# Patient Record
Sex: Male | Born: 1935 | Race: White | Hispanic: No | Marital: Single | Smoking: Never smoker
Health system: Southern US, Community
[De-identification: ages and names within clinical notes are randomized; demographics above are authoritative.]

## PROBLEM LIST (undated history)

## (undated) DIAGNOSIS — M1990 Unspecified osteoarthritis, unspecified site: Secondary | ICD-10-CM

## (undated) HISTORY — PX: APPENDECTOMY: SHX54

---

## 2001-07-31 ENCOUNTER — Encounter: Payer: Self-pay | Admitting: *Deleted

## 2001-07-31 ENCOUNTER — Emergency Department (HOSPITAL_COMMUNITY): Admission: EM | Admit: 2001-07-31 | Discharge: 2001-07-31 | Payer: Self-pay | Admitting: Emergency Medicine

## 2004-09-22 ENCOUNTER — Ambulatory Visit: Payer: Self-pay | Admitting: Family Medicine

## 2004-09-30 ENCOUNTER — Ambulatory Visit: Payer: Self-pay | Admitting: Gastroenterology

## 2004-10-12 ENCOUNTER — Ambulatory Visit: Payer: Self-pay | Admitting: Gastroenterology

## 2006-05-15 ENCOUNTER — Ambulatory Visit: Payer: Self-pay | Admitting: Family Medicine

## 2007-01-15 ENCOUNTER — Ambulatory Visit: Payer: Self-pay | Admitting: Family Medicine

## 2010-12-30 NOTE — Assessment & Plan Note (Signed)
Antonio [name]                              [facility] OFFICE NOTE   Antonio [name]                    MRN:          [id number]  DATE:05/15/2006                            DOB:          [birth year]    This is a 75 year old gentleman here for a complete physical examination.  He does have a couple of problems to discuss.  First off for six months, he  has had intermittent sharp pains in the lateral right hip.  There has been  no history of trauma.  He has taken no medications for it.  It primarily  bothers him when he does a lot of a walking or when he goes up and down  steps.  There is no back pain, no radiation of pain into the leg, no  neurologic deficits.  He also has had some mild problems with maintaining  and achieving erections over the past year.  He would like to try a  medication for that.   IMMUNIZATION HISTORY:  He got his yearly flu shot last week.   For other details of his past medical history, family history, social  history, etc., refer to last dictated clinic note when I met him on September 22, 2004.   ALLERGIES:  None.   CURRENT MEDICATIONS:  None.   OBJECTIVE:  Height 5 foot 11 inches.  Weight 190.  BP 128/70, pulse 72 and  regular.  GENERAL:  He appears to be quite healthy.  He walks and gets up and down  from the examination table easily.  SKIN:  Clear.  EYES:  Clear.  EARS:  Clear.  PHARYNX:  Clear.  NECK:  Supple without lymphadenopathy or masses.  LUNGS:  Clear.  CARDIAC:  Rate and rhythm regular without gallops, murmurs or rubs.  Distal  pulses are full.  EKG is within normal limits.  ABDOMEN:  Soft, bowel sounds, nontender, no masses.  GENITALIA:  Normal male.  RECTAL EXAM:  No masses or tenderness.  Prostate is within normal limits.  Stool hemoccult negative.  EXTREMITIES:  No clubbing, cyanosis, or edema.  Examination of the right hip  does show some mild pain on extremes of internal and external  rotation.  However, his range of motion is full and he has no crepitus.  NEUROLOGICAL:  Grossly intact.   ASSESSMENT/PLAN:  1. Complete physical:  She will get the usual laboratories.  2. Erectile dysfunction:  I gave him samples to try Cialis 20 mg as      needed.  3. 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            ______________________________  Tera Mater. Clent Ridges, MD     SAF/MedQ  DD:  05/15/2006  DT:  05/17/2006  Job #:  [id number]

## 2011-09-16 ENCOUNTER — Inpatient Hospital Stay: Payer: Self-pay | Admitting: Surgery

## 2011-09-16 LAB — CBC WITH DIFFERENTIAL/PLATELET
Basophil #: 0 10*3/uL (ref 0.0–0.1)
Basophil %: 0.2 %
Eosinophil #: 0.1 10*3/uL (ref 0.0–0.7)
HCT: 43.2 % (ref 40.0–52.0)
HGB: 14.7 g/dL (ref 13.0–18.0)
Lymphocyte %: 12 %
MCHC: 34.1 g/dL (ref 32.0–36.0)
MCV: 92 fL (ref 80–100)
Monocyte #: 1.2 10*3/uL — ABNORMAL HIGH (ref 0.0–0.7)
Monocyte %: 9.5 %
RDW: 12.4 % (ref 11.5–14.5)
WBC: 12.3 10*3/uL — ABNORMAL HIGH (ref 3.8–10.6)

## 2011-09-16 LAB — COMPREHENSIVE METABOLIC PANEL
Albumin: 3.5 g/dL (ref 3.4–5.0)
Alkaline Phosphatase: 106 U/L (ref 50–136)
Anion Gap: 10 (ref 7–16)
BUN: 20 mg/dL — ABNORMAL HIGH (ref 7–18)
Bilirubin,Total: 0.9 mg/dL (ref 0.2–1.0)
Chloride: 98 mmol/L (ref 98–107)
Co2: 27 mmol/L (ref 21–32)
Creatinine: 1.17 mg/dL (ref 0.60–1.30)
EGFR (African American): >60
EGFR (Non-African Amer.): >60
Glucose: 107 mg/dL — ABNORMAL HIGH (ref 65–99)
Osmolality: 273 (ref 275–301)
Sodium: 135 mmol/L — ABNORMAL LOW (ref 136–145)
Total Protein: 8.2 g/dL (ref 6.4–8.2)

## 2011-09-16 LAB — URINALYSIS, COMPLETE
Bacteria: NONE SEEN
Glucose,UR: NEGATIVE mg/dL (ref 0–75)
Leukocyte Esterase: NEGATIVE
Nitrite: NEGATIVE
Protein: 30
WBC UR: 2 /HPF (ref 0–5)

## 2011-09-16 LAB — LIPASE, BLOOD: Lipase: 56 U/L — ABNORMAL LOW (ref 73–393)

## 2011-09-16 LAB — PROTIME-INR: INR: 1.1

## 2011-09-17 LAB — BASIC METABOLIC PANEL
BUN: 17 mg/dL (ref 7–18)
Calcium, Total: 8 mg/dL — ABNORMAL LOW (ref 8.5–10.1)
Co2: 26 mmol/L (ref 21–32)
EGFR (African American): >60
Glucose: 176 mg/dL — ABNORMAL HIGH (ref 65–99)
Osmolality: 276 (ref 275–301)
Potassium: 4.6 mmol/L (ref 3.5–5.1)
Sodium: 135 mmol/L — ABNORMAL LOW (ref 136–145)

## 2011-09-17 LAB — CBC WITH DIFFERENTIAL/PLATELET
Basophil %: 0.2 %
Eosinophil #: 0 10*3/uL (ref 0.0–0.7)
Eosinophil %: 0.1 %
HCT: 41.1 % (ref 40.0–52.0)
Lymphocyte %: 6.9 %
MCHC: 33.8 g/dL (ref 32.0–36.0)
MCV: 92 fL (ref 80–100)
Neutrophil %: 87.9 %
Platelet: 373 10*3/uL (ref 150–440)
RBC: 4.48 10*6/uL (ref 4.40–5.90)
RDW: 12.6 % (ref 11.5–14.5)

## 2011-09-18 LAB — CBC WITH DIFFERENTIAL/PLATELET
Basophil #: 0 10*3/uL (ref 0.0–0.1)
Basophil %: 0.1 %
Eosinophil #: 0.1 10*3/uL (ref 0.0–0.7)
HCT: 33.9 % — ABNORMAL LOW (ref 40.0–52.0)
Lymphocyte %: 9.8 %
MCH: 30.8 pg (ref 26.0–34.0)
MCHC: 33.7 g/dL (ref 32.0–36.0)
Monocyte #: 0.9 10*3/uL — ABNORMAL HIGH (ref 0.0–0.7)
Monocyte %: 8.2 %
Neutrophil #: 9.2 10*3/uL — ABNORMAL HIGH (ref 1.4–6.5)
RBC: 3.71 10*6/uL — ABNORMAL LOW (ref 4.40–5.90)
RDW: 13.1 % (ref 11.5–14.5)

## 2011-09-18 LAB — BASIC METABOLIC PANEL
Anion Gap: 10 (ref 7–16)
Calcium, Total: 8 mg/dL — ABNORMAL LOW (ref 8.5–10.1)
Chloride: 98 mmol/L (ref 98–107)
Co2: 27 mmol/L (ref 21–32)
Creatinine: 1.24 mg/dL (ref 0.60–1.30)
EGFR (African American): >60
Osmolality: 270 (ref 275–301)

## 2011-09-19 LAB — CBC WITH DIFFERENTIAL/PLATELET
Basophil %: 0.8 %
Eosinophil #: 0.3 10*3/uL (ref 0.0–0.7)
MCH: 30.5 pg (ref 26.0–34.0)
MCHC: 33.1 g/dL (ref 32.0–36.0)
MCV: 92 fL (ref 80–100)
Monocyte #: 0.9 10*3/uL — ABNORMAL HIGH (ref 0.0–0.7)
Neutrophil #: 6.5 10*3/uL (ref 1.4–6.5)
Neutrophil %: 72.6 %
Platelet: 404 10*3/uL (ref 150–440)
RBC: 3.68 10*6/uL — ABNORMAL LOW (ref 4.40–5.90)
WBC: 8.9 10*3/uL (ref 3.8–10.6)

## 2011-09-19 LAB — PATHOLOGY REPORT

## 2011-09-20 LAB — CBC WITH DIFFERENTIAL/PLATELET
Basophil #: 0 10*3/uL (ref 0.0–0.1)
Basophil %: 0.5 %
Lymphocyte %: 18.4 %
MCH: 30.6 pg (ref 26.0–34.0)
MCHC: 33.3 g/dL (ref 32.0–36.0)
MCV: 92 fL (ref 80–100)
Monocyte #: 0.9 10*3/uL — ABNORMAL HIGH (ref 0.0–0.7)
Monocyte %: 10.6 %
Platelet: 530 10*3/uL — ABNORMAL HIGH (ref 150–440)
RDW: 12.9 % (ref 11.5–14.5)
WBC: 8.6 10*3/uL (ref 3.8–10.6)

## 2011-09-20 LAB — BASIC METABOLIC PANEL
Anion Gap: 9 (ref 7–16)
BUN: 3 mg/dL — ABNORMAL LOW (ref 7–18)
Creatinine: 1.22 mg/dL (ref 0.60–1.30)
EGFR (African American): >60
EGFR (Non-African Amer.): >60
Glucose: 99 mg/dL (ref 65–99)

## 2011-09-21 LAB — CULTURE, BLOOD (SINGLE)

## 2011-09-22 LAB — CBC WITH DIFFERENTIAL/PLATELET
Basophil #: 0 10*3/uL (ref 0.0–0.1)
Eosinophil %: 2.8 %
HCT: 35.5 % — ABNORMAL LOW (ref 40.0–52.0)
Lymphocyte #: 1.1 10*3/uL (ref 1.0–3.6)
MCH: 30.4 pg (ref 26.0–34.0)
MCV: 90 fL (ref 80–100)
Monocyte #: 1 10*3/uL — ABNORMAL HIGH (ref 0.0–0.7)
Monocyte %: 13.2 %
Neutrophil #: 5.2 10*3/uL (ref 1.4–6.5)
Platelet: 636 10*3/uL — ABNORMAL HIGH (ref 150–440)
RBC: 3.93 10*6/uL — ABNORMAL LOW (ref 4.40–5.90)
RDW: 12.7 % (ref 11.5–14.5)

## 2011-09-22 LAB — BASIC METABOLIC PANEL
Creatinine: 1.26 mg/dL (ref 0.60–1.30)
EGFR (African American): >60
EGFR (Non-African Amer.): 60 — ABNORMAL LOW
Glucose: 106 mg/dL — ABNORMAL HIGH (ref 65–99)
Osmolality: 271 (ref 275–301)
Potassium: 4.4 mmol/L (ref 3.5–5.1)
Sodium: 137 mmol/L (ref 136–145)

## 2011-09-24 LAB — CBC WITH DIFFERENTIAL/PLATELET
Basophil #: 0 10*3/uL (ref 0.0–0.1)
Basophil %: 0.2 %
Eosinophil #: 0.2 10*3/uL (ref 0.0–0.7)
MCH: 30.5 pg (ref 26.0–34.0)
MCV: 92 fL (ref 80–100)
Monocyte #: 0.8 10*3/uL — ABNORMAL HIGH (ref 0.0–0.7)
Monocyte %: 10.9 %
Neutrophil #: 5.2 10*3/uL (ref 1.4–6.5)
Platelet: 730 10*3/uL — ABNORMAL HIGH (ref 150–440)
RDW: 13.3 % (ref 11.5–14.5)
WBC: 7.8 10*3/uL (ref 3.8–10.6)

## 2011-09-26 LAB — CREATININE, SERUM
Creatinine: 1.45 mg/dL — ABNORMAL HIGH (ref 0.60–1.30)
EGFR (Non-African Amer.): 51 — ABNORMAL LOW

## 2011-09-28 ENCOUNTER — Other Ambulatory Visit: Payer: Self-pay | Admitting: Surgery

## 2011-09-28 LAB — BASIC METABOLIC PANEL
Anion Gap: 12 (ref 7–16)
BUN: 14 mg/dL (ref 7–18)
Chloride: 102 mmol/L (ref 98–107)
Co2: 26 mmol/L (ref 21–32)
EGFR (Non-African Amer.): 53 — ABNORMAL LOW
Glucose: 101 mg/dL — ABNORMAL HIGH (ref 65–99)
Osmolality: 280 (ref 275–301)

## 2011-09-28 LAB — CBC WITH DIFFERENTIAL/PLATELET
Basophil %: 0.8 %
Eosinophil #: 0.1 10*3/uL (ref 0.0–0.7)
Eosinophil %: 2.1 %
HGB: 12.5 g/dL — ABNORMAL LOW (ref 13.0–18.0)
Lymphocyte %: 25.4 %
MCH: 29.4 pg (ref 26.0–34.0)
MCV: 90 fL (ref 80–100)
Monocyte #: 0.6 10*3/uL (ref 0.0–0.7)
Monocyte %: 9.3 %
Neutrophil %: 62.4 %
Platelet: 711 10*3/uL — ABNORMAL HIGH (ref 150–440)
RBC: 4.25 10*6/uL — ABNORMAL LOW (ref 4.40–5.90)
WBC: 6.6 10*3/uL (ref 3.8–10.6)

## 2012-10-07 ENCOUNTER — Encounter: Payer: Self-pay | Admitting: Gastroenterology

## 2012-10-14 ENCOUNTER — Encounter: Payer: Self-pay | Admitting: Gastroenterology

## 2013-03-08 IMAGING — CT CT ABD-PELV W/ CM
1 of 2 series · 15 of 32 positions shown, 19 images · IV contrast (isovue)
Comparison: None

REASON FOR EXAM: (1) abd pain; (2) pel  pain
COMMENTS:

PROCEDURE:     CT  - CT ABDOMEN / PELVIS  W  - September 16, 2011  [DATE]
RESULT:     History: Abdominal pain
TECHNIQUE: Multiple axial images of the abdomen and pelvis were performed
from the lung bases to the pubic symphysis, with p.o. contrast and with 100
ml of Isovue 370 intravenous contrast.

[Series 2: 3mm soft tissue · axial · 0.71mm/px · z∈[-445,+14]mm · 15 of 169 slices shown, 19 images]
[im 8/169  soft-tissue]
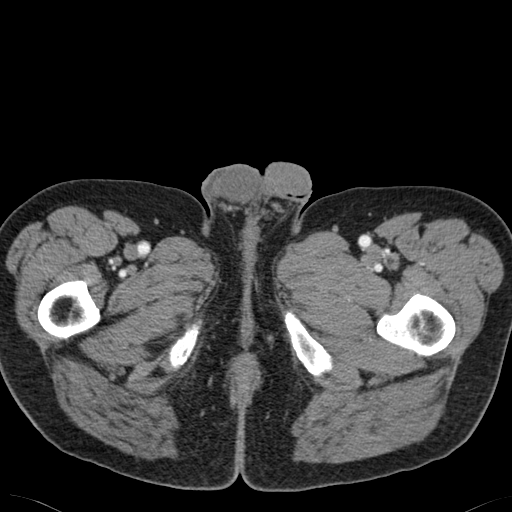
[im 8/169  bone]
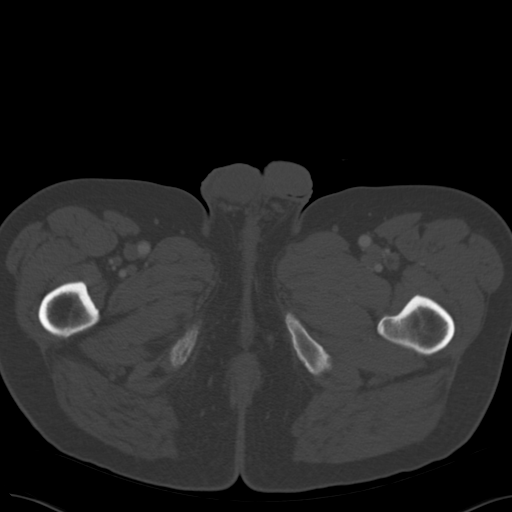
[im 22/169  soft-tissue]
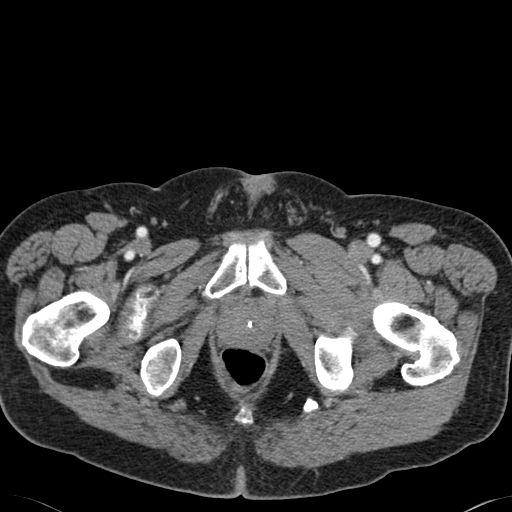
[im 37/169  soft-tissue]
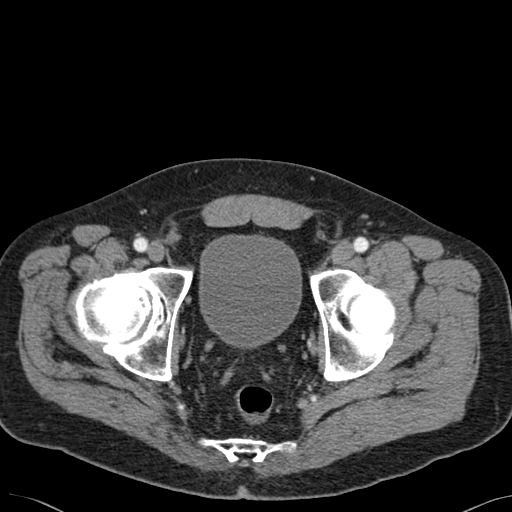
[im 44/169  soft-tissue]
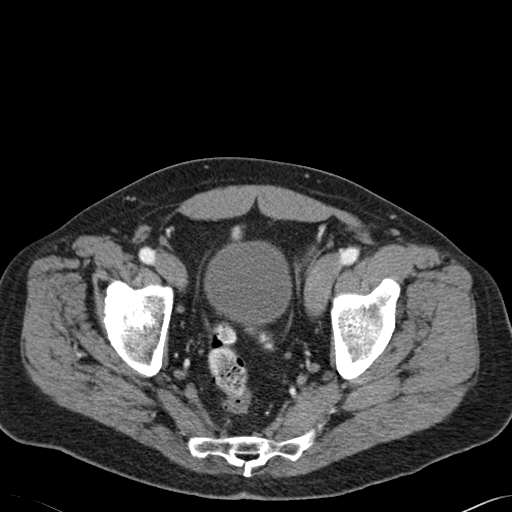
[im 59/169  soft-tissue]
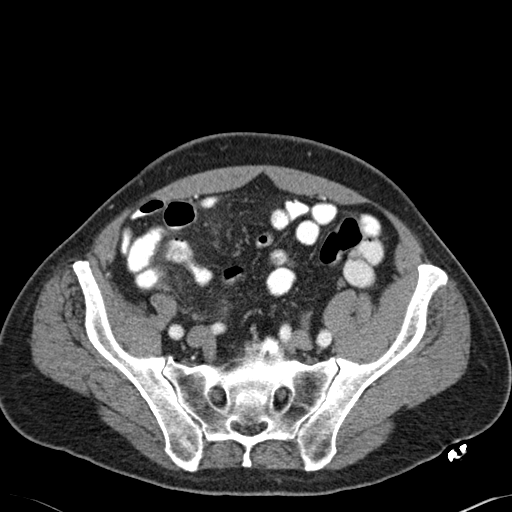
[im 74/169  soft-tissue]
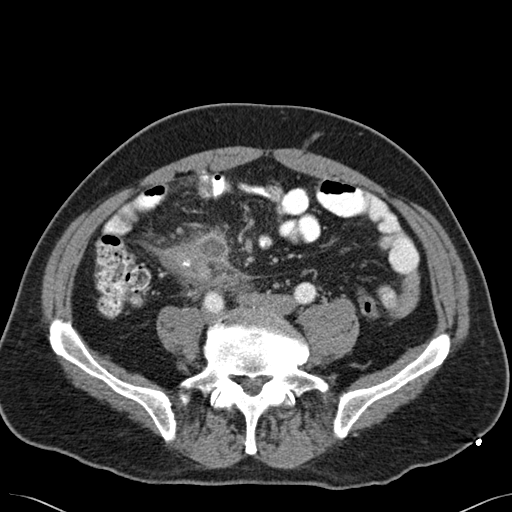
[im 88/169  soft-tissue]
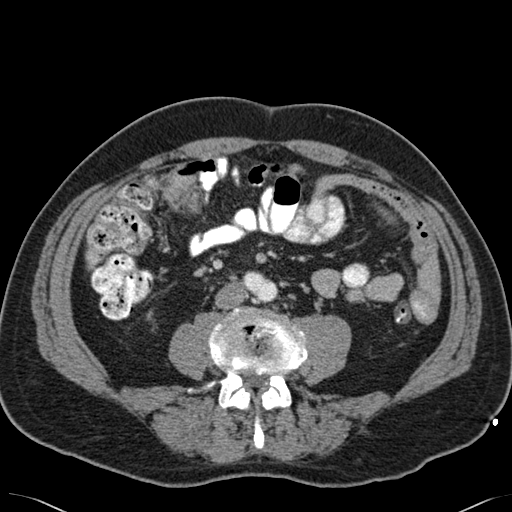
[im 95/169  soft-tissue]
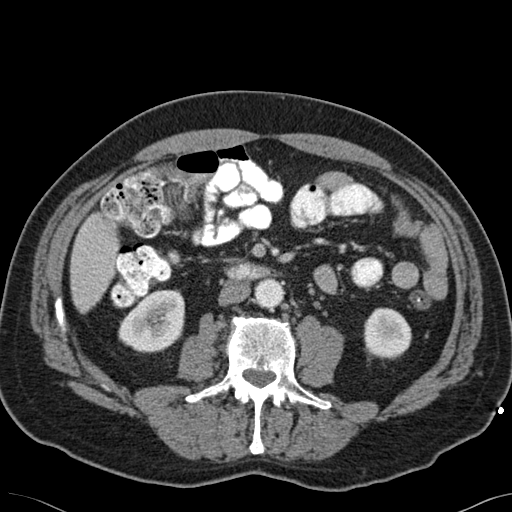
[im 110/169  soft-tissue]
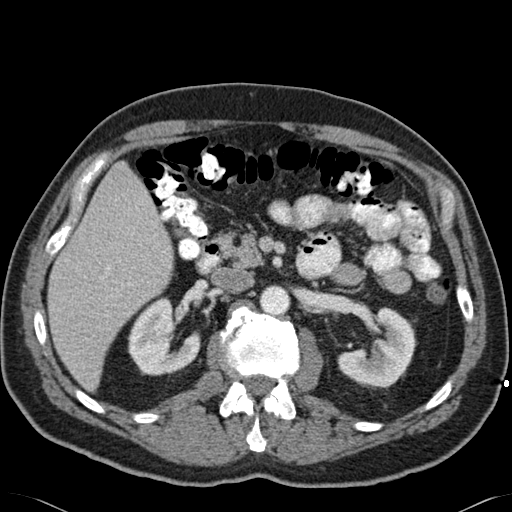
[im 110/169  bone]
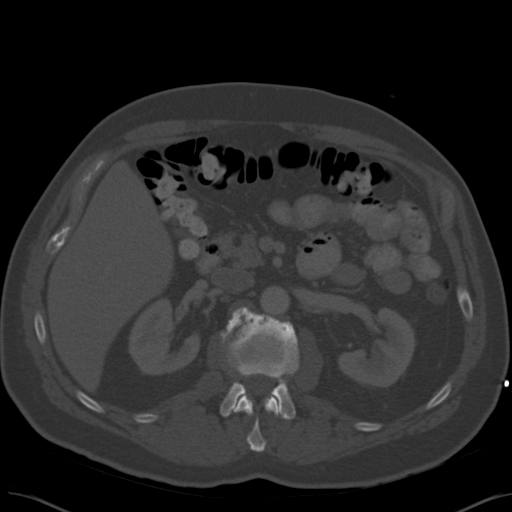
[im 125/169  soft-tissue]
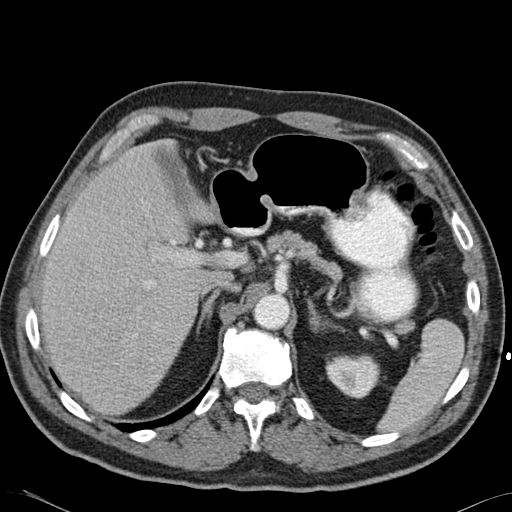
[im 132/169  soft-tissue]
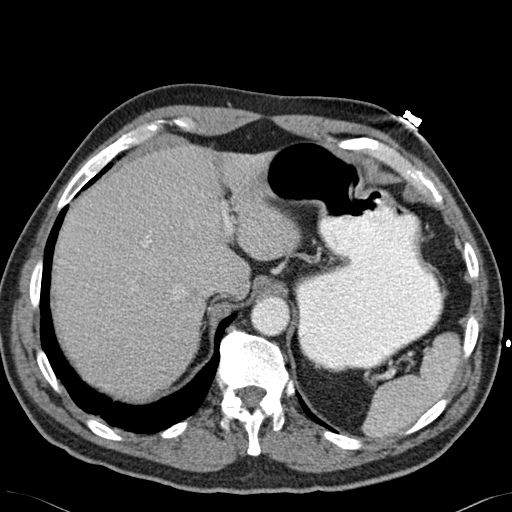
[im 139/169  lung]
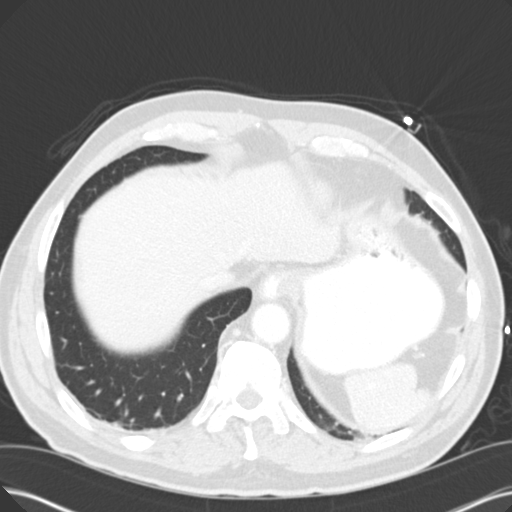
[im 147/169  soft-tissue]
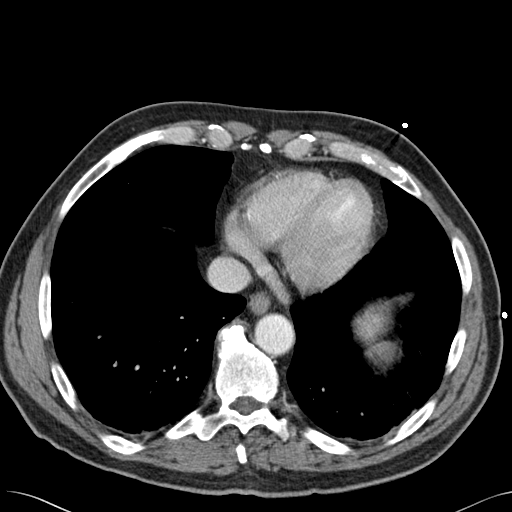
[im 147/169  lung]
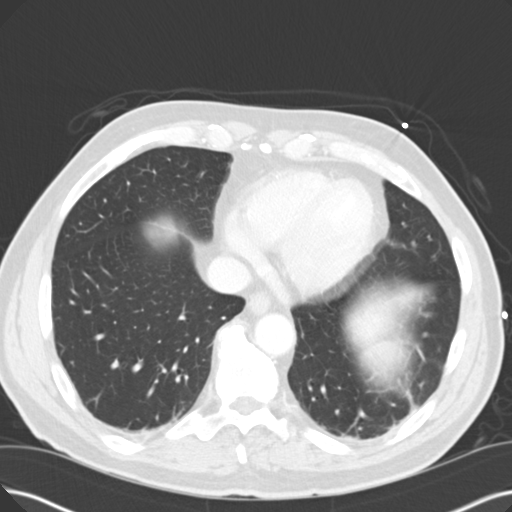
[im 154/169  lung]
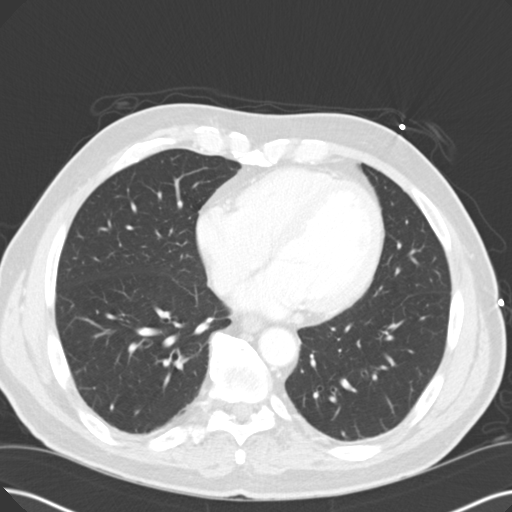
[im 161/169  soft-tissue]
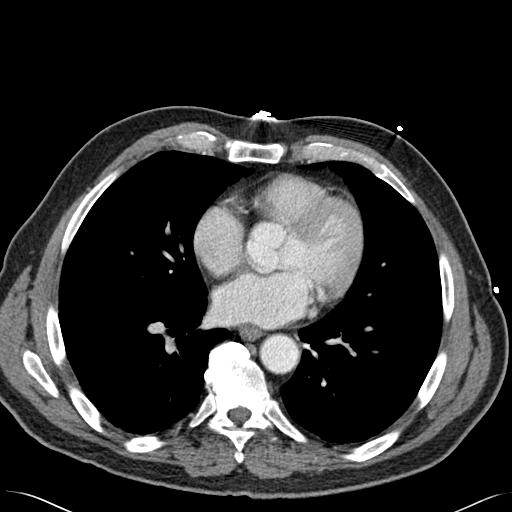
[im 161/169  lung]
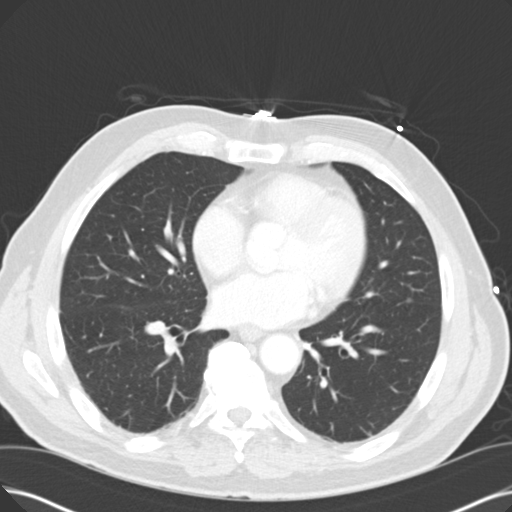

[15 of 32 positions shown; findings below may reference images not displayed]

FINDINGS: The lung bases are clear. There is no pneumothorax. The heart size is
normal.

The liver demonstrates no focal abnormality. There is no intrahepatic or
extrahepatic biliary ductal dilatation. The gallbladder is unremarkable. The
spleen demonstrates no focal abnormality. The kidneys, adrenal glands, and
pancreas are normal. The bladder is unremarkable.

The stomach, duodenum, small intestine, and large intestine demonstrate no
contrast extravasation or dilatation. The appendix is dilated measuring 15
mm in diameter with appendiceal wall thickening and periappendiceal
inflammatory changes. There is an appendicolith within the appendix. There
is a periappendiceal complex fluid collection with rim enhancement measuring
6.4 x 5.6 cm most consistent with an abscess. There is no pneumoperitoneum,
pneumatosis, or portal venous gas. There is no abdominal or pelvic free
fluid. There is no lymphadenopathy.

The abdominal aorta is normal in caliber .

The osseous structures are unremarkable.
IMPRESSION: Acute appendicitis with a 6.4 x 5.6 cm periappendiceal abscess.

## 2014-07-08 ENCOUNTER — Other Ambulatory Visit (HOSPITAL_COMMUNITY): Payer: Medicare Other | Admitting: Orthopaedic Surgery

## 2014-07-21 ENCOUNTER — Encounter (HOSPITAL_COMMUNITY)
Admission: RE | Admit: 2014-07-21 | Discharge: 2014-07-21 | Disposition: A | Discharge status: Home / Self Care | Payer: Medicare Other | Source: Ambulatory Visit | Attending: Orthopaedic Surgery | Admitting: Orthopaedic Surgery

## 2014-07-21 ENCOUNTER — Encounter (HOSPITAL_COMMUNITY): Payer: Self-pay

## 2014-07-21 DIAGNOSIS — Z01812 Encounter for preprocedural laboratory examination: Secondary | ICD-10-CM | POA: Diagnosis present

## 2014-07-21 HISTORY — DX: Unspecified osteoarthritis, unspecified site: M19.90

## 2014-07-21 LAB — BASIC METABOLIC PANEL
ANION GAP: 17 — AB (ref 5–15)
BUN: 18 mg/dL (ref 6–23)
CO2: 23 meq/L (ref 19–32)
CREATININE: 0.96 mg/dL (ref 0.50–1.35)
Calcium: 9.8 mg/dL (ref 8.4–10.5)
Chloride: 100 mEq/L (ref 96–112)
GFR calc Af Amer: >90 mL/min (ref >90)
GFR calc non Af Amer: 79 mL/min — ABNORMAL LOW (ref >90)
Glucose, Bld: 91 mg/dL (ref 70–99)
Potassium: 4.4 mEq/L (ref 3.7–5.3)
SODIUM: 140 meq/L (ref 137–147)

## 2014-07-21 LAB — URINALYSIS, ROUTINE W REFLEX MICROSCOPIC
Bilirubin Urine: NEGATIVE
Glucose, UA: NEGATIVE mg/dL
HGB URINE DIPSTICK: NEGATIVE
Ketones, ur: 15 mg/dL — AB
LEUKOCYTES UA: NEGATIVE
Nitrite: NEGATIVE
PH: 5 (ref 5.0–8.0)
Protein, ur: NEGATIVE mg/dL
SPECIFIC GRAVITY, URINE: 1.029 (ref 1.005–1.030)
Urobilinogen, UA: 0.2 mg/dL (ref 0.0–1.0)

## 2014-07-21 LAB — CBC
HEMATOCRIT: 46.7 % (ref 39.0–52.0)
HEMOGLOBIN: 16.3 g/dL (ref 13.0–17.0)
MCH: 31 pg (ref 26.0–34.0)
MCHC: 34.9 g/dL (ref 30.0–36.0)
MCV: 88.8 fL (ref 78.0–100.0)
Platelets: 261 10*3/uL (ref 150–400)
RBC: 5.26 MIL/uL (ref 4.22–5.81)
RDW: 12.2 % (ref 11.5–15.5)
WBC: 6.8 10*3/uL (ref 4.0–10.5)

## 2014-07-21 LAB — APTT: aPTT: 33 seconds (ref 24–37)

## 2014-07-21 LAB — SURGICAL PCR SCREEN
MRSA, PCR: NEGATIVE
Staphylococcus aureus: NEGATIVE

## 2014-07-21 LAB — PROTIME-INR
INR: 1.05 (ref 0.00–1.49)
Prothrombin Time: 13.8 seconds (ref 11.6–15.2)

## 2014-07-21 NOTE — Progress Notes (Signed)
Primary - dr. Jeannetta Napelkins No cardiologist No recent cardiac testing

## 2014-07-21 NOTE — Pre-Procedure Instructions (Signed)
Antonio SowJesse D [name]  07/21/2014   Your procedure is scheduled on:  Tuesday, December 15th  [facility] at 130 PM.  Call this number if you have problems the morning of surgery: [phone]   Remember:   Do not eat food or drink liquids after midnight.   Take these medicines the morning of surgery with A SIP OF WATER: none   Do not wear jewelry.  Do not wear lotions, powders, or perfume, deodorant.  Do not shave 48 hours prior to surgery. Men may shave face and neck.  Do not bring valuables to the hospital.  [facility] is not responsible   for any belongings or valuables.               Contacts, dentures or bridgework may not be worn into surgery.  Leave suitcase in the car. After surgery it may be brought to your room.  For patients admitted to the hospital, discharge time is determined by your treatment team.              Please read over the following fact sheets that you were given: Pain Booklet, Coughing and Deep Breathing, MRSA Information and Surgical Site Infection Prevention  [facility] - Preparing for Surgery  Before surgery, you can play an important role.  Because skin is not sterile, your skin needs to be as free of germs as possible.  You can reduce the number of germs on you skin by washing with CHG (chlorahexidine gluconate) soap before surgery.  CHG is an antiseptic cleaner which kills germs and bonds with the skin to continue killing germs even after washing.  Please DO NOT use if you have an allergy to CHG or antibacterial soaps.  If your skin becomes reddened/irritated stop using the CHG and inform your nurse when you arrive at Short Stay.  Do not shave (including legs and underarms) for at least 48 hours prior to the first CHG shower.  You may shave your face.  Please follow these instructions carefully:   1.  Shower with CHG Soap the night before surgery and the morning of Surgery.  2.  If you choose to wash your hair, wash your  hair first as usual with your normal shampoo.  3.  After you shampoo, rinse your hair and body thoroughly to remove the shampoo.  4.  Use CHG as you would any other liquid soap.  You can apply CHG directly to the skin and wash gently with scrungie or a clean washcloth.  5.  Apply the CHG Soap to your body ONLY FROM THE NECK DOWN.  Do not use on open wounds or open sores.  Avoid contact with your eyes, ears, mouth and genitals (private parts).  Wash genitals (private parts) with your normal soap.  6.  Wash thoroughly, paying special attention to the area where your surgery will be performed.  7.  Thoroughly rinse your body with warm water from the neck down.  8.  DO NOT shower/wash with your normal soap after using and rinsing off the CHG Soap.  9.  Pat yourself dry with a clean towel.            10.  Wear clean pajamas.            11.  Place clean sheets on your bed the night of your first shower and do not sleep with pets.  Day of Surgery  Do not apply any lotions/deoderants the morning of  surgery.  Please wear clean clothes to the hospital/surgery center.

## 2014-07-27 MED ORDER — CEFAZOLIN SODIUM-DEXTROSE 2-3 GM-% IV SOLR
2.0000 g | INTRAVENOUS | Status: AC
  Administered 2014-07-28: 2 g via INTRAVENOUS
  Filled 2014-07-27: qty 50

## 2014-07-28 ENCOUNTER — Inpatient Hospital Stay (HOSPITAL_COMMUNITY): Payer: Medicare Other

## 2014-07-28 ENCOUNTER — Inpatient Hospital Stay (HOSPITAL_COMMUNITY): Payer: Medicare Other | Admitting: Anesthesiology

## 2014-07-28 ENCOUNTER — Encounter (HOSPITAL_COMMUNITY): Payer: Self-pay | Admitting: *Deleted

## 2014-07-28 ENCOUNTER — Encounter (HOSPITAL_COMMUNITY)
Admission: RE | Disposition: A | Discharge status: Skilled Nursing Facility | Payer: Self-pay | Source: Ambulatory Visit | Attending: Orthopaedic Surgery

## 2014-07-28 ENCOUNTER — Inpatient Hospital Stay (HOSPITAL_COMMUNITY)
Admission: RE | Admit: 2014-07-28 | Discharge: 2014-07-31 | DRG: 470 | Disposition: A | Discharge status: Skilled Nursing Facility | Payer: Medicare Other | Source: Ambulatory Visit | Attending: Orthopaedic Surgery | Admitting: Orthopaedic Surgery

## 2014-07-28 DIAGNOSIS — Z7982 Long term (current) use of aspirin: Secondary | ICD-10-CM

## 2014-07-28 DIAGNOSIS — Z96641 Presence of right artificial hip joint: Secondary | ICD-10-CM

## 2014-07-28 DIAGNOSIS — M1612 Unilateral primary osteoarthritis, left hip: Secondary | ICD-10-CM

## 2014-07-28 DIAGNOSIS — M1611 Unilateral primary osteoarthritis, right hip: Secondary | ICD-10-CM | POA: Diagnosis present

## 2014-07-28 DIAGNOSIS — Z419 Encounter for procedure for purposes other than remedying health state, unspecified: Secondary | ICD-10-CM

## 2014-07-28 DIAGNOSIS — M25551 Pain in right hip: Secondary | ICD-10-CM | POA: Diagnosis present

## 2014-07-28 HISTORY — PX: TOTAL HIP ARTHROPLASTY: SHX124

## 2014-07-28 SURGERY — ARTHROPLASTY, HIP, TOTAL, ANTERIOR APPROACH
Anesthesia: General | Site: Hip | Laterality: Right

## 2014-07-28 MED ORDER — METHOCARBAMOL 500 MG PO TABS
500.0000 mg | ORAL_TABLET | Freq: Four times a day (QID) | ORAL | Status: DC | PRN
  Administered 2014-07-29 – 2014-07-30 (×4): 500 mg via ORAL
  Filled 2014-07-28 (×6): qty 1

## 2014-07-28 MED ORDER — HYDROMORPHONE HCL 1 MG/ML IJ SOLN
INTRAMUSCULAR | Status: AC
  Filled 2014-07-28: qty 1

## 2014-07-28 MED ORDER — ROCURONIUM BROMIDE 100 MG/10ML IV SOLN
INTRAVENOUS | Status: DC | PRN
  Administered 2014-07-28: 40 mg via INTRAVENOUS

## 2014-07-28 MED ORDER — MIDAZOLAM HCL 2 MG/2ML IJ SOLN
INTRAMUSCULAR | Status: AC
  Filled 2014-07-28: qty 2

## 2014-07-28 MED ORDER — TRANEXAMIC ACID 100 MG/ML IV SOLN
1000.0000 mg | INTRAVENOUS | Status: AC
  Administered 2014-07-28: 1000 mg via INTRAVENOUS
  Filled 2014-07-28: qty 10

## 2014-07-28 MED ORDER — LACTATED RINGERS IV SOLN
INTRAVENOUS | Status: DC | PRN
  Administered 2014-07-28 (×2): via INTRAVENOUS

## 2014-07-28 MED ORDER — ACETAMINOPHEN 650 MG RE SUPP: 650.0000 mg | Freq: Four times a day (QID) | RECTAL | Status: DC | PRN

## 2014-07-28 MED ORDER — CEFAZOLIN SODIUM 1-5 GM-% IV SOLN
1.0000 g | Freq: Four times a day (QID) | INTRAVENOUS | Status: AC
  Administered 2014-07-28 – 2014-07-29 (×2): 1 g via INTRAVENOUS
  Filled 2014-07-28 (×2): qty 50

## 2014-07-28 MED ORDER — ROCURONIUM BROMIDE 50 MG/5ML IV SOLN
INTRAVENOUS | Status: AC
  Filled 2014-07-28: qty 1

## 2014-07-28 MED ORDER — HYDROMORPHONE HCL 1 MG/ML IJ SOLN
1.0000 mg | INTRAMUSCULAR | Status: DC | PRN
  Administered 2014-07-29: 1 mg via INTRAVENOUS
  Filled 2014-07-28 (×2): qty 1

## 2014-07-28 MED ORDER — ONDANSETRON HCL 4 MG/2ML IJ SOLN
INTRAMUSCULAR | Status: AC
  Filled 2014-07-28: qty 2

## 2014-07-28 MED ORDER — ONDANSETRON HCL 4 MG PO TABS: 4.0000 mg | ORAL_TABLET | Freq: Four times a day (QID) | ORAL | Status: DC | PRN

## 2014-07-28 MED ORDER — PROMETHAZINE HCL 25 MG/ML IJ SOLN: 6.2500 mg | INTRAMUSCULAR | Status: DC | PRN

## 2014-07-28 MED ORDER — OXYCODONE HCL 5 MG/5ML PO SOLN: 5.0000 mg | Freq: Once | ORAL | Status: AC | PRN

## 2014-07-28 MED ORDER — LIDOCAINE HCL (CARDIAC) 20 MG/ML IV SOLN
INTRAVENOUS | Status: AC
  Filled 2014-07-28: qty 10

## 2014-07-28 MED ORDER — FENTANYL CITRATE 0.05 MG/ML IJ SOLN
INTRAMUSCULAR | Status: DC | PRN
  Administered 2014-07-28: 50 ug via INTRAVENOUS
  Administered 2014-07-28: 25 ug via INTRAVENOUS
  Administered 2014-07-28 (×3): 50 ug via INTRAVENOUS
  Administered 2014-07-28: 150 ug via INTRAVENOUS
  Administered 2014-07-28: 75 ug via INTRAVENOUS

## 2014-07-28 MED ORDER — HYDROMORPHONE HCL 1 MG/ML IJ SOLN
0.2500 mg | INTRAMUSCULAR | Status: DC | PRN
  Administered 2014-07-28 (×4): 0.5 mg via INTRAVENOUS

## 2014-07-28 MED ORDER — SODIUM CHLORIDE 0.9 % IV SOLN
INTRAVENOUS | Status: DC
  Administered 2014-07-28: 21:00:00 via INTRAVENOUS

## 2014-07-28 MED ORDER — METOCLOPRAMIDE HCL 5 MG/ML IJ SOLN: 5.0000 mg | Freq: Three times a day (TID) | INTRAMUSCULAR | Status: DC | PRN

## 2014-07-28 MED ORDER — MIDAZOLAM HCL 5 MG/5ML IJ SOLN
INTRAMUSCULAR | Status: DC | PRN
  Administered 2014-07-28: 2 mg via INTRAVENOUS

## 2014-07-28 MED ORDER — ARTIFICIAL TEARS OP OINT
TOPICAL_OINTMENT | OPHTHALMIC | Status: AC
  Filled 2014-07-28: qty 3.5

## 2014-07-28 MED ORDER — SODIUM CHLORIDE 0.9 % IR SOLN
Status: DC | PRN
  Administered 2014-07-28: 3000 mL

## 2014-07-28 MED ORDER — OXYCODONE HCL 5 MG PO TABS
5.0000 mg | ORAL_TABLET | Freq: Once | ORAL | Status: AC | PRN
  Administered 2014-07-28: 5 mg via ORAL

## 2014-07-28 MED ORDER — DIPHENHYDRAMINE HCL 12.5 MG/5ML PO ELIX: 12.5000 mg | ORAL_SOLUTION | ORAL | Status: DC | PRN

## 2014-07-28 MED ORDER — NEOSTIGMINE METHYLSULFATE 10 MG/10ML IV SOLN
INTRAVENOUS | Status: DC | PRN
  Administered 2014-07-28: 4 mg via INTRAVENOUS

## 2014-07-28 MED ORDER — ACETAMINOPHEN 325 MG PO TABS
650.0000 mg | ORAL_TABLET | Freq: Four times a day (QID) | ORAL | Status: DC | PRN
  Filled 2014-07-28: qty 2

## 2014-07-28 MED ORDER — METOCLOPRAMIDE HCL 10 MG PO TABS: 5.0000 mg | ORAL_TABLET | Freq: Three times a day (TID) | ORAL | Status: DC | PRN

## 2014-07-28 MED ORDER — LIDOCAINE HCL (CARDIAC) 20 MG/ML IV SOLN
INTRAVENOUS | Status: DC | PRN
  Administered 2014-07-28: 100 mg via INTRAVENOUS

## 2014-07-28 MED ORDER — OXYCODONE HCL 5 MG PO TABS
5.0000 mg | ORAL_TABLET | ORAL | Status: DC | PRN
  Administered 2014-07-28 – 2014-07-29 (×2): 10 mg via ORAL
  Administered 2014-07-29 (×2): 5 mg via ORAL
  Administered 2014-07-30 (×4): 10 mg via ORAL
  Filled 2014-07-28: qty 1
  Filled 2014-07-28 (×7): qty 2
  Filled 2014-07-28 (×2): qty 1
  Filled 2014-07-28: qty 2

## 2014-07-28 MED ORDER — ALUM & MAG HYDROXIDE-SIMETH 200-200-20 MG/5ML PO SUSP
30.0000 mL | ORAL | Status: DC | PRN
  Filled 2014-07-28: qty 30

## 2014-07-28 MED ORDER — 0.9 % SODIUM CHLORIDE (POUR BTL) OPTIME
TOPICAL | Status: DC | PRN
  Administered 2014-07-28: 1000 mL

## 2014-07-28 MED ORDER — PROPOFOL 10 MG/ML IV BOLUS
INTRAVENOUS | Status: DC | PRN
  Administered 2014-07-28: 160 mg via INTRAVENOUS

## 2014-07-28 MED ORDER — ASPIRIN EC 325 MG PO TBEC
325.0000 mg | DELAYED_RELEASE_TABLET | Freq: Two times a day (BID) | ORAL | Status: DC
  Administered 2014-07-29 – 2014-07-31 (×5): 325 mg via ORAL
  Filled 2014-07-28 (×7): qty 1

## 2014-07-28 MED ORDER — METHOCARBAMOL 1000 MG/10ML IJ SOLN
500.0000 mg | Freq: Four times a day (QID) | INTRAVENOUS | Status: DC | PRN
  Administered 2014-07-28: 500 mg via INTRAVENOUS
  Filled 2014-07-28 (×3): qty 5

## 2014-07-28 MED ORDER — ONDANSETRON HCL 4 MG/2ML IJ SOLN
INTRAMUSCULAR | Status: DC | PRN
  Administered 2014-07-28: 4 mg via INTRAVENOUS

## 2014-07-28 MED ORDER — PROPOFOL 10 MG/ML IV BOLUS
INTRAVENOUS | Status: AC
  Filled 2014-07-28: qty 20

## 2014-07-28 MED ORDER — LACTATED RINGERS IV SOLN
INTRAVENOUS | Status: DC
  Administered 2014-07-28: 13:00:00 via INTRAVENOUS

## 2014-07-28 MED ORDER — POLYETHYLENE GLYCOL 3350 17 G PO PACK: 17.0000 g | PACK | Freq: Every day | ORAL | Status: DC | PRN

## 2014-07-28 MED ORDER — FENTANYL CITRATE 0.05 MG/ML IJ SOLN
INTRAMUSCULAR | Status: AC
  Filled 2014-07-28: qty 5

## 2014-07-28 MED ORDER — HYDROMORPHONE HCL 1 MG/ML IJ SOLN
0.2500 mg | INTRAMUSCULAR | Status: DC | PRN
  Administered 2014-07-28: 0.25 mg via INTRAVENOUS
  Administered 2014-07-28 (×2): 0.5 mg via INTRAVENOUS

## 2014-07-28 MED ORDER — ZOLPIDEM TARTRATE 5 MG PO TABS
5.0000 mg | ORAL_TABLET | Freq: Every evening | ORAL | Status: DC | PRN
Start: 2014-07-28 — End: 2014-07-31

## 2014-07-28 MED ORDER — OXYCODONE HCL 5 MG PO TABS
ORAL_TABLET | ORAL | Status: AC
  Filled 2014-07-28: qty 1

## 2014-07-28 MED ORDER — PHENOL 1.4 % MT LIQD: 1.0000 | OROMUCOSAL | Status: DC | PRN

## 2014-07-28 MED ORDER — GLYCOPYRROLATE 0.2 MG/ML IJ SOLN
INTRAMUSCULAR | Status: DC | PRN
Start: 2014-07-28 — End: 2014-07-28
  Administered 2014-07-28: 0.6 mg via INTRAVENOUS

## 2014-07-28 MED ORDER — DOCUSATE SODIUM 100 MG PO CAPS
100.0000 mg | ORAL_CAPSULE | Freq: Two times a day (BID) | ORAL | Status: DC
  Administered 2014-07-29 – 2014-07-31 (×5): 100 mg via ORAL
  Filled 2014-07-28 (×8): qty 1

## 2014-07-28 MED ORDER — EPHEDRINE SULFATE 50 MG/ML IJ SOLN
INTRAMUSCULAR | Status: DC | PRN
  Administered 2014-07-28 (×2): 5 mg via INTRAVENOUS
  Administered 2014-07-28: 10 mg via INTRAVENOUS
  Administered 2014-07-28 (×2): 5 mg via INTRAVENOUS
  Administered 2014-07-28: 10 mg via INTRAVENOUS

## 2014-07-28 MED ORDER — ONDANSETRON HCL 4 MG/2ML IJ SOLN: 4.0000 mg | Freq: Four times a day (QID) | INTRAMUSCULAR | Status: DC | PRN

## 2014-07-28 MED ORDER — MENTHOL 3 MG MT LOZG: 1.0000 | LOZENGE | OROMUCOSAL | Status: DC | PRN

## 2014-07-28 SURGICAL SUPPLY — 57 items
APL SKNCLS STERI-STRIP NONHPOA (GAUZE/BANDAGES/DRESSINGS) ×1
BENZOIN TINCTURE PRP APPL 2/3 (GAUZE/BANDAGES/DRESSINGS) ×3 IMPLANT
BLADE SAW SGTL 18X1.27X75 (BLADE) ×2 IMPLANT
BLADE SAW SGTL 18X1.27X75MM (BLADE) ×1
BLADE SURG ROTATE 9660 (MISCELLANEOUS) IMPLANT
BNDG GAUZE ELAST 4 BULKY (GAUZE/BANDAGES/DRESSINGS) IMPLANT
CAPT HIP TOTAL 2 ×3 IMPLANT
CELLS DAT CNTRL 66122 CELL SVR (MISCELLANEOUS) ×1 IMPLANT
CLOSURE WOUND 1/2 X4 (GAUZE/BANDAGES/DRESSINGS) ×1
COVER SURGICAL LIGHT HANDLE (MISCELLANEOUS) ×3 IMPLANT
DRAPE C-ARM 42X72 X-RAY (DRAPES) ×3 IMPLANT
DRAPE IMP U-DRAPE 54X76 (DRAPES) ×3 IMPLANT
DRAPE STERI IOBAN 125X83 (DRAPES) ×3 IMPLANT
DRAPE U-SHAPE 47X51 STRL (DRAPES) ×9 IMPLANT
DRSG AQUACEL AG ADV 3.5X10 (GAUZE/BANDAGES/DRESSINGS) ×3 IMPLANT
DURAPREP 26ML APPLICATOR (WOUND CARE) ×3 IMPLANT
ELECT BLADE 4.0 EZ CLEAN MEGAD (MISCELLANEOUS)
ELECT BLADE 6.5 EXT (BLADE) IMPLANT
ELECT CAUTERY BLADE 6.4 (BLADE) ×1 IMPLANT
ELECT REM PT RETURN 9FT ADLT (ELECTROSURGICAL) ×3
ELECTRODE BLDE 4.0 EZ CLN MEGD (MISCELLANEOUS) IMPLANT
ELECTRODE REM PT RTRN 9FT ADLT (ELECTROSURGICAL) ×1 IMPLANT
FACESHIELD WRAPAROUND (MASK) ×6 IMPLANT
FACESHIELD WRAPAROUND OR TEAM (MASK) ×2 IMPLANT
GLOVE BIOGEL PI IND STRL 8 (GLOVE) ×2 IMPLANT
GLOVE BIOGEL PI INDICATOR 8 (GLOVE) ×4
GLOVE ECLIPSE 8.0 STRL XLNG CF (GLOVE) ×3 IMPLANT
GLOVE ORTHO TXT STRL SZ7.5 (GLOVE) ×6 IMPLANT
GOWN STRL REUS W/ TWL XL LVL3 (GOWN DISPOSABLE) ×2 IMPLANT
GOWN STRL REUS W/TWL XL LVL3 (GOWN DISPOSABLE) ×6
HANDPIECE INTERPULSE COAX TIP (DISPOSABLE) ×3
KIT BASIN OR (CUSTOM PROCEDURE TRAY) ×3 IMPLANT
KIT ROOM TURNOVER OR (KITS) ×3 IMPLANT
MANIFOLD NEPTUNE II (INSTRUMENTS) ×3 IMPLANT
NS IRRIG 1000ML POUR BTL (IV SOLUTION) ×3 IMPLANT
PACK TOTAL JOINT (CUSTOM PROCEDURE TRAY) ×3 IMPLANT
PACK UNIVERSAL I (CUSTOM PROCEDURE TRAY) ×3 IMPLANT
PAD ARMBOARD 7.5X6 YLW CONV (MISCELLANEOUS) ×3 IMPLANT
RETRACTOR WND ALEXIS 18 MED (MISCELLANEOUS) ×1 IMPLANT
RTRCTR WOUND ALEXIS 18CM MED (MISCELLANEOUS) ×3
SET HNDPC FAN SPRY TIP SCT (DISPOSABLE) ×1 IMPLANT
SPONGE LAP 18X18 X RAY DECT (DISPOSABLE) IMPLANT
SPONGE LAP 4X18 X RAY DECT (DISPOSABLE) IMPLANT
STRIP CLOSURE SKIN 1/2X4 (GAUZE/BANDAGES/DRESSINGS) ×3 IMPLANT
SUT ETHIBOND NAB CT1 #1 30IN (SUTURE) ×3 IMPLANT
SUT MNCRL AB 4-0 PS2 18 (SUTURE) ×3 IMPLANT
SUT VIC AB 0 CT1 27 (SUTURE) ×3
SUT VIC AB 0 CT1 27XBRD ANBCTR (SUTURE) ×1 IMPLANT
SUT VIC AB 1 CT1 27 (SUTURE) ×3
SUT VIC AB 1 CT1 27XBRD ANBCTR (SUTURE) ×1 IMPLANT
SUT VIC AB 2-0 CT1 27 (SUTURE) ×9
SUT VIC AB 2-0 CT1 TAPERPNT 27 (SUTURE) ×1 IMPLANT
TOWEL OR 17X24 6PK STRL BLUE (TOWEL DISPOSABLE) ×3 IMPLANT
TOWEL OR 17X26 10 PK STRL BLUE (TOWEL DISPOSABLE) ×3 IMPLANT
TRAY FOLEY CATH 16FRSI W/METER (SET/KITS/TRAYS/PACK) IMPLANT
WATER STERILE IRR 1000ML POUR (IV SOLUTION) ×2 IMPLANT
YANKAUER SUCT BULB TIP NO VENT (SUCTIONS) ×2 IMPLANT

## 2014-07-28 NOTE — H&P (Signed)
TOTAL HIP ADMISSION H&P  Patient is admitted for right total hip arthroplasty.  Subjective:  Chief Complaint: right hip pain  HPI: Jeani SowJesse D Buckman, 78 y.o. male, has a history of pain and functional disability in the right hip(s) due to arthritis and patient has failed non-surgical conservative treatments for greater than 12 weeks to include NSAID's and/or analgesics, flexibility and strengthening excercises and activity modification.  Onset of symptoms was gradual starting 1 years ago with gradually worsening course since that time.The patient noted no past surgery on the right hip(s).  Patient currently rates pain in the right hip at 8 out of 10 with activity. Patient has night pain, worsening of pain with activity and weight bearing, trendelenberg gait, pain that interfers with activities of daily living, pain with passive range of motion and crepitus. Patient has evidence of subchondral cysts, subchondral sclerosis, periarticular osteophytes and joint space narrowing by imaging studies. This condition presents safety issues increasing the risk of falls.  There is no current active infection.  Patient Active Problem List   Diagnosis Date Noted  . Primary osetoarthritis of left hip 07/28/2014   Past Medical History  Diagnosis Date  . Arthritis     Past Surgical History  Procedure Laterality Date  . Appendectomy      No prescriptions prior to admission   No Known Allergies  History  Substance Use Topics  . Smoking status: Never Smoker   . Smokeless tobacco: Not on file  . Alcohol Use: No    No family history on file.   Review of Systems  Musculoskeletal: Positive for joint pain.  All other systems reviewed and are negative.   Objective:  Physical Exam  Constitutional: He is oriented to person, place, and time. He appears well-developed and well-nourished.  HENT:  Head: Normocephalic and atraumatic.  Eyes: EOM are normal. Pupils are equal, round, and reactive to light.   Neck: Normal range of motion. Neck supple.  Cardiovascular: Normal rate and regular rhythm.   Respiratory: Effort normal and breath sounds normal.  GI: Soft. Bowel sounds are normal.  Musculoskeletal:       Right hip: He exhibits decreased range of motion, decreased strength and tenderness.  Neurological: He is alert and oriented to person, place, and time.  Skin: Skin is warm and dry.  Psychiatric: He has a normal mood and affect.    Vital signs in last 24 hours:    Labs:   There is no height or weight on file to calculate BMI.   Imaging Review Plain radiographs demonstrate severe degenerative joint disease of the right hip(s). The bone quality appears to be good for age and reported activity level.  Assessment/Plan:  End stage arthritis, right hip(s)  The patient history, physical examination, clinical judgement of the provider and imaging studies are consistent with end stage degenerative joint disease of the right hip(s) and total hip arthroplasty is deemed medically necessary. The treatment options including medical management, injection therapy, arthroscopy and arthroplasty were discussed at length. The risks and benefits of total hip arthroplasty were presented and reviewed. The risks due to aseptic loosening, infection, stiffness, dislocation/subluxation,  thromboembolic complications and other imponderables were discussed.  The patient acknowledged the explanation, agreed to proceed with the plan and consent was signed. Patient is being admitted for inpatient treatment for surgery, pain control, PT, OT, prophylactic antibiotics, VTE prophylaxis, progressive ambulation and ADL's and discharge planning.The patient is planning to be discharged home with home health services

## 2014-07-28 NOTE — Anesthesia Preprocedure Evaluation (Addendum)
Anesthesia Evaluation  Patient identified by MRN, date of birth, ID band Patient awake    Reviewed: Allergy & Precautions, H&P , NPO status , Patient's Chart, lab work & pertinent test results  Airway Mallampati: II  TM Distance: >3 FB Neck ROM: Full    Dental  (+) Teeth Intact, Dental Advisory Given,    Pulmonary  breath sounds clear to auscultation        Cardiovascular Rhythm:Regular     Neuro/Psych    GI/Hepatic   Endo/Other    Renal/GU      Musculoskeletal  (+) Arthritis -,   Abdominal (+)  Abdomen: soft.    Peds  Hematology   Anesthesia Other Findings   Reproductive/Obstetrics                           Anesthesia Physical Anesthesia Plan  ASA: II  Anesthesia Plan: General   Post-op Pain Management:    Induction: Intravenous  Airway Management Planned: Oral ETT  Additional Equipment:   Intra-op Plan:   Post-operative Plan: Extubation in OR  Informed Consent: I have reviewed the patients History and Physical, chart, labs and discussed the procedure including the risks, benefits and alternatives for the proposed anesthesia with the patient or authorized representative who has indicated his/her understanding and acceptance.   Dental advisory given  Plan Discussed with: CRNA, Anesthesiologist and Surgeon  Anesthesia Plan Comments:         Anesthesia Quick Evaluation

## 2014-07-28 NOTE — Transfer of Care (Signed)
Immediate Anesthesia Transfer of Care Note  Patient: Antonio [name]  Procedure(s) Performed: Procedure(s): RIGHT TOTAL HIP ARTHROPLASTY ANTERIOR APPROACH (Right)  Patient Location: PACU  Anesthesia Type:General  Level of Consciousness: awake and alert   Airway & Oxygen Therapy: Patient Spontanous Breathing and Patient connected to nasal cannula oxygen  Post-op Assessment: Report given to PACU RN, Post -op Vital signs reviewed and stable and Patient moving all extremities X 4  Post vital signs: Reviewed and stable  Complications: No apparent anesthesia complications

## 2014-07-28 NOTE — Anesthesia Procedure Notes (Signed)
Procedure Name: Intubation Date/Time: 07/28/2014 4:12 PM Performed by: Rise PatienceBELL, Asenath Balash T Pre-anesthesia Checklist: Patient identified, Emergency Drugs available, Suction available and Patient being monitored Patient Re-evaluated:Patient Re-evaluated prior to inductionOxygen Delivery Method: Circle system utilized Preoxygenation: Pre-oxygenation with 100% oxygen Intubation Type: IV induction Ventilation: Mask ventilation without difficulty Laryngoscope Size: Miller and 2 Grade View: Grade II Tube type: Oral Tube size: 7.5 mm Number of attempts: 1 Airway Equipment and Method: Stylet Placement Confirmation: ETT inserted through vocal cords under direct vision,  positive ETCO2 and breath sounds checked- equal and bilateral Secured at: 22 cm Tube secured with: Tape Dental Injury: Teeth and Oropharynx as per pre-operative assessment

## 2014-07-28 NOTE — Brief Op Note (Signed)
07/28/2014  5:28 PM  PATIENT:  Antonio [name]  78 y.o. male  PRE-OPERATIVE DIAGNOSIS:  Severe osteoarthritis right hip  POST-OPERATIVE DIAGNOSIS:  Severe osteoarthritis right hip  PROCEDURE:  Procedure(s): RIGHT TOTAL HIP ARTHROPLASTY ANTERIOR APPROACH (Right)  SURGEON:  Surgeon(s) and Role:    * Kathryne Hitchhristopher Y Theodoro Koval, MD - Primary  PHYSICIAN ASSISTANT: Rexene EdisonGil Clark, PA-C  ANESTHESIA:   general  EBL:  Total I/O In: 1000 [I.V.:1000] Out: 350 [Blood:350]  BLOOD ADMINISTERED:none  DRAINS: none   LOCAL MEDICATIONS USED:  NONE  SPECIMEN:  No Specimen  DISPOSITION OF SPECIMEN:  N/A  COUNTS:  YES  TOURNIQUET:  * No tourniquets in log *  DICTATION: .Other Dictation: Dictation Number [id number]  PLAN OF CARE: Admit to inpatient   PATIENT DISPOSITION:  PACU - hemodynamically stable.   Delay start of Pharmacological VTE agent (>24hrs) due to surgical blood loss or risk of bleeding: no

## 2014-07-28 NOTE — Anesthesia Postprocedure Evaluation (Signed)
  Anesthesia Post-op Note  Patient: Antonio SowJesse D [name]  Procedure(s) Performed: Procedure(s): RIGHT TOTAL HIP ARTHROPLASTY ANTERIOR APPROACH (Right)  Patient Location: PACU  Anesthesia Type:General  Level of Consciousness: awake and alert   Airway and Oxygen Therapy: Patient Spontanous Breathing  Post-op Pain: mild  Post-op Assessment: Post-op Vital signs reviewed  Post-op Vital Signs: Reviewed  Last Vitals:  Filed Vitals:   07/28/14 2000  BP: 119/75  Pulse: 63  Temp: 36.3 C  Resp: 12    Complications: No apparent anesthesia complications

## 2014-07-29 ENCOUNTER — Encounter (HOSPITAL_COMMUNITY): Payer: Self-pay | Admitting: Orthopaedic Surgery

## 2014-07-29 LAB — CBC
HCT: 38.2 % — ABNORMAL LOW (ref 39.0–52.0)
HEMOGLOBIN: 13.1 g/dL (ref 13.0–17.0)
MCH: 30.9 pg (ref 26.0–34.0)
MCHC: 34.3 g/dL (ref 30.0–36.0)
MCV: 90.1 fL (ref 78.0–100.0)
Platelets: 210 10*3/uL (ref 150–400)
RBC: 4.24 MIL/uL (ref 4.22–5.81)
RDW: 12.3 % (ref 11.5–15.5)
WBC: 7.4 10*3/uL (ref 4.0–10.5)

## 2014-07-29 LAB — BASIC METABOLIC PANEL
Anion gap: 11 (ref 5–15)
BUN: 14 mg/dL (ref 6–23)
CO2: 27 meq/L (ref 19–32)
Calcium: 8.4 mg/dL (ref 8.4–10.5)
Chloride: 103 mEq/L (ref 96–112)
Creatinine, Ser: 0.93 mg/dL (ref 0.50–1.35)
GFR calc Af Amer: >90 mL/min (ref >90)
GFR calc non Af Amer: 80 mL/min — ABNORMAL LOW (ref >90)
Glucose, Bld: 102 mg/dL — ABNORMAL HIGH (ref 70–99)
POTASSIUM: 4.2 meq/L (ref 3.7–5.3)
SODIUM: 141 meq/L (ref 137–147)

## 2014-07-29 NOTE — Evaluation (Signed)
Occupational Therapy Evaluation Patient Details Name: Antonio MeuseJesse D Lem MRN: [id number] DOB: 09-05-1935 Today's Date: 07/29/2014    History of Present Illness s/p Right Direct Anterior THA   Clinical Impression   Pt s/p above. Feel pt will benefit from acute OT to increase independence with BADLs, PTA. Plan to practice LB ADLs next session as well as figure out tub versus shower transfer technique (or defer to home health).     Follow Up Recommendations  Home health OT;Supervision - Intermittent    Equipment Recommendations  3 in 1 bedside comode;Other (comment) (AE)    Recommendations for Other Services       Precautions / Restrictions Precautions Precautions: Fall Restrictions Weight Bearing Restrictions: Yes RLE Weight Bearing: Weight bearing as tolerated      Mobility Bed Mobility Overal bed mobility: Needs Assistance Bed Mobility: Supine to Sit     Supine to sit: Min guard     General bed mobility comments: very effortful. Cues for technique.   Transfers Overall transfer level: Needs assistance Equipment used: Rolling walker (2 wheeled) Transfers: Sit to/from Stand Sit to Stand: Min guard;Min assist         General transfer comment: assist when going from standing to sitting in recliner to help slide out RLE.  Min guard for sit to stand from bed.    Balance                                            ADL Overall ADL's : Needs assistance/impaired     Grooming: Wash/dry hands;Standing;Set up;Supervision/safety               Lower Body Dressing: Moderate assistance;Sit to/from stand (with AE)   Toilet Transfer: Min guard;Ambulation;Comfort height toilet;RW   Toileting- Clothing Manipulation and Hygiene: Min guard (standing)       Functional mobility during ADLs: Min guard;Rolling walker General ADL Comments: Educated on AE/cost/where to purchase. Pt practiced with sockaid. Educated on safety (rugs, sitting for most of LB  ADLs, use of bag on walker, recommended someone be with him for tub transfer). Educated on tub transfer technique of backing to chair and swinging legs in and discussed options for shower chair. Educated on 3 in 1.  Pt donned underwear with reacher and pulled up over hips sitting down (did not want to stand due to pain) and OT explained that he could also do other LB ADLs sitting and leaning.     Vision                     Perception     Praxis      Pertinent Vitals/Pain Pain Assessment: 0-10 Pain Score: 8  Pain Location: Rt hip (when moving) Pain Descriptors / Indicators: Burning Pain Intervention(s): Limited activity within patient's tolerance;Monitored during session;Repositioned;Other (comment) (notified nurse)     Hand Dominance     Extremity/Trunk Assessment Upper Extremity Assessment Upper Extremity Assessment: Overall WFL for tasks assessed   Lower Extremity Assessment Lower Extremity Assessment: Defer to PT evaluation       Communication Communication Communication: No difficulties   Cognition Arousal/Alertness: Awake/alert Behavior During Therapy: WFL for tasks assessed/performed Overall Cognitive Status: Within Functional Limits for tasks assessed                     General Comments  Exercises       Shoulder Instructions      Home Living Family/patient expects to be discharged to:: Private residence Living Arrangements: Alone Available Help at Discharge: Other (Comment) (reports friends and neighbors to assist; will need to verify) Type of Home: House Home Access: Stairs to enter Entergy CorporationEntrance Stairs-Number of Steps: 1 Entrance Stairs-Rails: None Home Layout: One level     Bathroom Shower/Tub: Tub/shower unit;Walk-in Human resources officershower   Bathroom Toilet: Standard     Home Equipment: Adaptive equipment (standard walker) Adaptive Equipment:  (long brush)        Prior Functioning/Environment Level of Independence: Independent              OT Diagnosis: Acute pain   OT Problem List: Decreased strength;Decreased range of motion;Decreased activity tolerance;Decreased knowledge of use of DME or AE;Decreased knowledge of precautions;Pain   OT Treatment/Interventions: Self-care/ADL training;DME and/or AE instruction;Therapeutic activities;Patient/family education;Balance training    OT Goals(Current goals can be found in the care plan section) Acute Rehab OT Goals Patient Stated Goal: not stated OT Goal Formulation: With patient Time For Goal Achievement: 08/05/14 Potential to Achieve Goals: Good ADL Goals Pt Will Perform Lower Body Dressing: with set-up;with supervision;with adaptive equipment;sit to/from stand Pt Will Transfer to Toilet: with modified independence;ambulating (3 in 1 over commode) Pt Will Perform Tub/Shower Transfer: Shower transfer;with supervision;ambulating;Tub transfer;shower seat;3 in 1;rolling walker (pt wanting to use shower-unable to fit walker in there)  OT Frequency: Min 2X/week   Barriers to D/C:            Co-evaluation              End of Session Equipment Utilized During Treatment: Gait belt;Rolling walker Nurse Communication: Mobility status;Other (comment) (pain; d/c recommendations)  Activity Tolerance: Patient limited by pain Patient left: in chair;with call bell/phone within reach   Time: [id number] OT Time Calculation (min): 24 min Charges:  OT General Charges $OT Visit: 1 Procedure OT Evaluation $Initial OT Evaluation Tier I: 1 Procedure OT Treatments $Self Care/Home Management : 8-22 mins G-CodesEarlie Raveling:    Bryton Romagnoli L OTR/L [phone] 07/29/2014, 5:36 PM

## 2014-07-29 NOTE — Op Note (Signed)
NAMVicente Males:  Antonio [name], Antonio [name]             ACCOUNT NO.:  [id number]  MEDICAL RECORD NO.:  [id number]  LOCATION:  [id number]                        FACILITY:  [facility]  PHYSICIAN:  Antonio [name], M.D.DATE OF BIRTH:  1936-08-01  DATE OF PROCEDURE:  07/28/2014 DATE OF DISCHARGE:                              OPERATIVE REPORT   PREOPERATIVE DIAGNOSES:  Severe end-stage primary osteoarthritis and degenerative joint disease, right hip.  POSTOPERATIVE DIAGNOSES:  Severe end-stage primary osteoarthritis and degenerative joint disease, right hip.  PROCEDURE:  Right total hip arthroplasty through direct anterior approach.  IMPLANTS:  DePuy Sector Gription acetabular component size 54, apex hole eliminator, size 36+ 4 neutral polyethylene liner, size 12 Corail femoral component with standard offset, size 36+ 5 ceramic hip ball.  SURGEON:  Antonio [name], M.D.  ASSISTANT:  Richardean CanalGilbert Clark, PA-C  ANESTHESIA:  General.  ANTIBIOTICS:  2 g IV Ancef.  BLOOD LOSS:  350 mL.  COMPLICATIONS:  None.  INDICATIONS:  Antonio [name] is a very pleasant 78 year old gentleman with debilitating arthritis involving his right hip.  X-rays show complete loss of his right hip joint space with subluxation of his right hip.  He has periarticular osteophytes and severe sclerotic changes.  He walks with a significant Trendelenburg gait and is short on his right side by about an inch compared to his left side.  We have recommended hip replacement surgery due to his daily pain, his decreased mobility and the impact this has had on his quality of life.  He understands the risks of acute blood loss anemia, nerve and vessel injury, fracture, infection, dislocation and DVT.  He understands the goals are decreased pain, improved mobility, and overall improved quality of life.  PROCEDURE DESCRIPTION:  After informed consent was obtained, appropriate right hip was marked.  He was brought to the operating room.   General anesthesia was obtained while he was on the stretcher.  He was next placed supine on the Hana fracture table with a perineal post in place and both legs in inline skeletal traction devices, but no traction applied.  His right operative hip was then prepped and draped with DuraPrep and sterile drapes.  A time-out was called to identify correct patient, correct right hip.  I then made an incision inferior and posterior to the anterior-superior iliac spine and carried this obliquely down the leg.  I dissected down to the tensor fascia lata muscle and the tensor fascia was then divided longitudinally, so we could proceed with a direct anterior approach to the hip.  We identified and cauterized the lateral femoral circumflex vessels.  I then placed a Cobra retractor around the lateral neck and up underneath the rectus femoris, a Cobra retractor medially.  I opened up the hip capsule in an L-type format finding a large joint effusion and significant osteophytes around the femoral head and the neck.  I placed the Cobra retractors within the arthrotomy.  Next, I made my femoral neck cut with an oscillating saw proximal to the lesser trochanter and completed this with an osteotome.  I placed a corkscrew guide in the femoral head and removed the femoral head in its entirety and found it to be completely devoid of cartilage.  I then cleaned the acetabular rim into the acetabulum labrum and debris. We placed a bent Hohmann medially and Cobra retractor laterally and began reaming under direct visualization from a size 42 reamer all the way up to a size 54, with all reamers under direct visualization and a last reamer under direct fluoroscopy so we could obtain our depth of reaming our inclination and anteversion. Once I was pleased with this, I placed the real DePuy Sector Gription acetabular component size 54, the apex hole eliminator and a 36+ 4 neutral polyethylene liner for a size 54  acetabular component. Attention was then turned to the femur.  With the leg externally rotated to 100 degrees extended and adducted, we were able to place a medial retractor medially and a Hohmann retractor behind the greater trochanter.  I released the lateral joint capsule and then used a box cutting osteotome to enter femoral canal and a rongeur to lateralize.  I then began broaching from a size 8 broach using the Corail broaching system from the DePuy up to a size 12.  With a size 12, we trialed the standard neck and a 36+ 5 hip ball.  Since we had made a lower neck cut, we reduced this in the acetabulum.  With the trial components in place, I was very pleased with his offset in his leg lengths as well.  We improved these dramatically and he was stable with internal and external rotation.  I then removed the trial components and placed the real Corail femoral component size 12, the real 36+ 5 ceramic hip ball and reduced this back in the acetabulum and it was stable.  We copiously irrigated the soft tissues with normal saline solution using pulsatile lavage.  We closed the joint capsule with interrupted #1 Ethibond suture followed by a running #1 Vicryl in the tensor fascia, 0 Vicryl in the deep tissue, 2-0 Vicryl in subcutaneous tissue, 4-0 Monocryl subcuticular stitch and Steri-Strips on the skin and an Aquacel dressing was applied.  He was then taken off the Hana table awakened, extubated, and taken to recovery room in stable condition.  All final counts were correct.  There were no complications noted.  Of note, Richardean CanalGilbert Clark, PA- C, assisted during the entire case and his assistance was crucial for facilitating all aspects of this case.     Antonio Pandahristopher Y. Magnus [name], M.D.     CYB/MEDQ  D:  07/28/2014  T:  07/29/2014  Job:  [id number]

## 2014-07-29 NOTE — Evaluation (Signed)
Physical Therapy Evaluation Patient Details Name: Antonio [name]: [id number] DOB: 04-27-1936 Today's Date: 07/29/2014   History of Present Illness  s/p Right Direct Anterior THA  Past Medical History  Diagnosis Date  . Arthritis    Past Surgical History  Procedure Laterality Date  . Appendectomy       Clinical Impression  Pt is s/p THA resulting in the deficits listed below (see PT Problem List).  Pt will benefit from skilled PT to increase their independence and safety with mobility to allow discharge to the venue listed below.   I believe home will be the most therapeutic environment for Antonio [name], and he does not have assist available to him until Saturday; Given his decreased fucntional independence and impairments to work on, it is a viable and therapeutic option for him to stay in the acute care setting to continue work on mobility and ADLs until he can dc home with assist on Saturday; I would favor this option over SNF       Follow Up Recommendations Home health PT;Supervision/Assistance - 24 hour    Equipment Recommendations  Rolling walker with 5" wheels;3in1 (PT) (is hesitant to use RW and 3in1)    Recommendations for Other Services OT consult     Precautions / Restrictions Precautions Precautions: None Restrictions RLE Weight Bearing: Weight bearing as tolerated      Mobility  Bed Mobility Overal bed mobility: Needs Assistance Bed Mobility: Supine to Sit     Supine to sit: Min guard     General bed mobility comments: Slow moving due to pain; minguard with step-by-step cues; Noted the need to repeat cues, likely as pt is internally distracted by pain; used bedrails and HOB elevated  Transfers Overall transfer level: Needs assistance Equipment used: Rolling walker (2 wheeled) Transfers: Sit to/from Stand Sit to Stand: Min guard;Min assist         General transfer comment: Cues for positioning, safety, hand placement, and technique; min  assist to control descent to sit  Ambulation/Gait Ambulation/Gait assistance: Min guard Ambulation Distance (Feet): 40 Feet Assistive device: Rolling walker (2 wheeled) Gait Pattern/deviations: Step-to pattern Gait velocity: slow   General Gait Details: Cues for gait sequence for forward stepping and backward stepping and to keep RW close for max support;   Stairs            Wheelchair Mobility    Modified Rankin (Stroke Patients Only)       Balance                                             Pertinent Vitals/Pain Pain Assessment: 0-10 Pain Score: 5  Pain Location: R hip with therex Pain Descriptors / Indicators: Aching;Grimacing Pain Intervention(s): Limited activity within patient's tolerance;Monitored during session;Repositioned    Home Living Family/patient expects to be discharged to:: Private residence Living Arrangements: Alone Available Help at Discharge: Other (Comment) (Reports friends and neighbors assist; will need to verify) Type of Home: House Home Access: Stairs to enter Entrance Stairs-Rails: None Entrance Stairs-Number of Steps: 1 Home Layout: One level Home Equipment: Other (comment) (has standard walker)      Prior Function Level of Independence: Independent               Hand Dominance        Extremity/Trunk Assessment   Upper Extremity Assessment: Overall WFL for  tasks assessed           Lower Extremity Assessment: RLE deficits/detail RLE Deficits / Details: Grossly decr ROM limited by pain       Communication   Communication: No difficulties  Cognition Arousal/Alertness: Awake/alert Behavior During Therapy: WFL for tasks assessed/performed Overall Cognitive Status: Within Functional Limits for tasks assessed                      General Comments      Exercises Total Joint Exercises Ankle Circles/Pumps: AROM;Both;10 reps Quad Sets: AROM;Right;5 reps Heel Slides: AROM;Right;5  reps Hip ABduction/ADduction: AAROM;Right;5 reps      Assessment/Plan    PT Assessment Patient needs continued PT services  PT Diagnosis Difficulty walking;Acute pain   PT Problem List Decreased strength;Decreased range of motion;Decreased activity tolerance;Decreased balance;Decreased mobility;Decreased knowledge of use of DME;Decreased safety awareness;Decreased knowledge of precautions;Pain  PT Treatment Interventions DME instruction;Gait training;Stair training;Functional mobility training;Therapeutic activities;Therapeutic exercise;Balance training;Patient/family education   PT Goals (Current goals can be found in the Care Plan section) Acute Rehab PT Goals Patient Stated Goal: less pain PT Goal Formulation: With patient Time For Goal Achievement: 08/05/14 Potential to Achieve Goals: Good    Frequency 7X/week   Barriers to discharge Decreased caregiver support I believe home will be the most therapeutic environment for Antonio [name], and he does not have assist available to him until Saturday; Given his decreased fucntional independence and impairments to work on, Iit is a viable and therapeutic option for him to stay in the acute care setting to continue work on mobility and ADLs until he can dc home with assist on Saturday    Co-evaluation               End of Session Equipment Utilized During Treatment: Gait belt Activity Tolerance: Patient tolerated treatment well Patient left: in chair;with call bell/phone within reach Nurse Communication: Mobility status         Time: [id number] PT Time Calculation (min) (ACUTE ONLY): 27 min   Charges:   PT Evaluation $Initial PT Evaluation Tier I: 1 Procedure PT Treatments $Gait Training: 8-22 mins   PT G Codes:          Olen PelGarrigan, Tyffany Waldrop Hamff 07/29/2014, 10:21 AM  Van ClinesHolly Anajah Sterbenz, PT  Acute Rehabilitation Services Pager [phone] Office [phone]

## 2014-07-29 NOTE — Care Management Note (Addendum)
CARE MANAGEMENT NOTE 07/29/2014  Patient:  Perriello,Jamier D   Account Number:  [id number]  Date Initiated:  07/29/2014  Documentation initiated by:  Vance PeperBRADY,Romell Cavanah  Subjective/Objective Assessment:   78 yr old male admitted with osteoarthritis of right hip. Patient had a right toal hip arthroplasty.     Action/Plan:   Case manager spoke with patient concerning home health and DME needs. Choice offered. [facility] No changes.   Anticipated DC Date:  08/01/2014   Anticipated DC Plan:  HOME W HOME HEALTH SERVICES      DC Planning Services  CM consult      [facility]   Choice offered to / List presented to:  C-1 Patient        HH arranged  HH-2 PT      [facility]  [facility]   Status of service:  In process, will continue to follow Medicare Important Message given?   (If response is "NO", the following Medicare IM given date fields will be blank) Date Medicare IM given:   Medicare IM given by:   Date Additional Medicare IM given:   Additional Medicare IM given by:    Discharge Disposition:    Per UR Regulation:  Reviewed for med. necessity/level of care/duration of stay  If discussed at Long Length of Stay Meetings, dates discussed:    Comments:  07/29/14 10:08am Vance PeperSusan Nash Bolls, RN BSN Case Manager. Case manager spoke with [facility] Parolly,  physical  therapist concerning patient's slow progression. CM also discussed progression and available support at discharge with Mr. Sobocinski. Patient states he lives alone, and  will have someone that can stay with him starting on Saturday. He is concerned about safety being home alone. Patient states he doesnt need a 3in1 and has a standard walker that he prefers to use.  Therapist will continue to work with patient, case manager will continue to monitor. CM contacted Dr.Blackman's nurse-Ashley, to inform him of above.

## 2014-07-29 NOTE — Care Management Note (Deleted)
CARE MANAGEMENT NOTE 07/29/2014  Patient:  Antonio [name],Antonio [name]   Account Number:  [id number]  Date Initiated:  07/29/2014  Documentation initiated by:  Eiko Mcgowen  Subjective/Objective Assessment:   78 yr old male admitted with left knee DJD. Patient had a left total knee arthroplasty.     Action/Plan:   Case manager spoke with patient concerning home health and DME. Patient was preoperatively setup with [facility], no changes. Patient has rolling walker, 3in1. Has family support at discharge.   Anticipated DC Date:  07/30/2014   Anticipated DC Plan:  HOME W HOME HEALTH SERVICES      DC Planning Services  CM consult      PAC Choice  HOME HEALTH  DURABLE MEDICAL EQUIPMENT   Choice offered to / List presented to:  C-1 Patient   DME arranged  CPM      DME agency  [employer]     HH arranged  HH-1 RN  HH-2 PT      HH agency  [facility]   Status of service:  Completed, signed off Medicare Important Message given?   (If response is "NO", the following Medicare IM given date fields will be blank) Date Medicare IM given:   Medicare IM given by:   Date Additional Medicare IM given:   Additional Medicare IM given by:    Discharge Disposition:  HOME W HOME HEALTH SERVICES  Per UR Regulation:  Reviewed for med. necessity/level of care/duration of stay  

## 2014-07-29 NOTE — Plan of Care (Signed)
Problem: Consults Goal: Diagnosis- Total Joint Replacement Primary Total Hip  Right (anterior) approach

## 2014-07-29 NOTE — Progress Notes (Signed)
Utilization review completed.  

## 2014-07-29 NOTE — Progress Notes (Signed)
Physical Therapy Treatment Patient Details Name: Nelida MeuseJesse D Netto MRN: [id number] DOB: [birth year] Today's Date: 07/29/2014    History of Present Illness s/p Right Direct Anterior THA     PT Comments    Slower progress for second session, limited by pain; Continued the discussion re: helpful and appropriate DME upon dc home; pt continues to endorse wanting his own SW and not needing a 3in1; It may be beneficial to work with SW and RW in the same session, and assess performance with each to compare;   Very much appreciate Case Mgr's input for this case   Follow Up Recommendations  Home health PT;Supervision/Assistance - 24 hour     Equipment Recommendations  Rolling walker with 5" wheels;3in1 (PT) (is hesitant to use RW and 3in1)    Recommendations for Other Services OT consult     Precautions / Restrictions Precautions Precautions: None Restrictions RLE Weight Bearing: Weight bearing as tolerated    Mobility  Bed Mobility Overal bed mobility: Needs Assistance Bed Mobility: Supine to Sit     Supine to sit: Min guard     General bed mobility comments: Slow moving due to pain; minguard with step-by-step cues; Noted the need to repeat cues, likely as pt is internally distracted by pain; used bedrails and HOB elevated  Transfers Overall transfer level: Needs assistance Equipment used: Rolling walker (2 wheeled) Transfers: Sit to/from Stand Sit to Stand: Min guard         General transfer comment: Cues for positioning, safety, hand placement, and technique; better control descent to sit  Ambulation/Gait Ambulation/Gait assistance: Min guard Ambulation Distance (Feet): 60 Feet Assistive device: Rolling walker (2 wheeled) Gait Pattern/deviations: Step-to pattern Gait velocity: slow   General Gait Details: Cues for gait sequence for gait sequence; Noted incr pain with R stance and tendency to flex hip and trunk in R stance   Stairs            Wheelchair  Mobility    Modified Rankin (Stroke Patients Only)       Balance                                    Cognition Arousal/Alertness: Awake/alert Behavior During Therapy: WFL for tasks assessed/performed Overall Cognitive Status: Within Functional Limits for tasks assessed                      Exercises      General Comments        Pertinent Vitals/Pain Pain Assessment: 0-10 Pain Score: 5  Pain Location: R hip Pain Descriptors / Indicators: Aching Pain Intervention(s): Repositioned;Patient requesting pain meds-RN notified    Home Living                      Prior Function            PT Goals (current goals can now be found in the care plan section) Acute Rehab PT Goals Patient Stated Goal: less pain PT Goal Formulation: With patient Time For Goal Achievement: 08/05/14 Potential to Achieve Goals: Good Progress towards PT goals: Progressing toward goals (slow progress, limited by pain)    Frequency  7X/week    PT Plan Current plan remains appropriate    Co-evaluation             End of Session Equipment Utilized During Treatment: Gait belt Activity Tolerance: Patient tolerated treatment  well Patient left: in chair;with call bell/phone within reach     Time: 1154-1211 PT Time Calculation (min) (ACUTE ONLY): 17 min  Charges:  $Gait Training: 8-22 mins                    G Codes:      Van ClinesGarrigan, Quindon Denker Hamff 07/29/2014, 2:09 PM  Van ClinesHolly Keagon [place], [place]PT  Acute Rehabilitation Services Pager [phone] Office [phone]

## 2014-07-30 LAB — CBC
HCT: 37 % — ABNORMAL LOW (ref 39.0–52.0)
Hemoglobin: 12.6 g/dL — ABNORMAL LOW (ref 13.0–17.0)
MCH: 30.3 pg (ref 26.0–34.0)
MCHC: 34.1 g/dL (ref 30.0–36.0)
MCV: 88.9 fL (ref 78.0–100.0)
PLATELETS: 181 10*3/uL (ref 150–400)
RBC: 4.16 MIL/uL — AB (ref 4.22–5.81)
RDW: 12.2 % (ref 11.5–15.5)
WBC: 8.1 10*3/uL (ref 4.0–10.5)

## 2014-07-30 NOTE — Progress Notes (Signed)
Clinical Social Work Department BRIEF PSYCHOSOCIAL ASSESSMENT 07/30/2014  Patient:  Fosnaugh,Gleb D     Account Number:  [id number]     Admit date:  07/28/2014  Clinical Social Worker:  Ulyess Blossom  Date/Time:  07/30/2014 05:25 PM  Referred by:  RN  Date Referred:  07/30/2014 Referred for  SNF Placement   Other Referral:   Interview type:  Patient Other interview type:   Database review.    PSYCHOSOCIAL DATA Living Status:  ALONE Admitted from facility:   Level of care:   Primary support name:  bobbie monnett Primary support relationship to patient:  FRIEND Degree of support available:   Undetermined at this time.  Pt also has a son, Maynor, as contact.    CURRENT CONCERNS Current Concerns  Post-Acute Placement   Other Concerns:    SOCIAL WORK ASSESSMENT / PLAN CSW met with pt briefly to discuss role of CSW/d/c planning.  Pt lived alone and was independent with ADLS pta.  Pt is agreeable to ST SNF placement to help him return to his baseline and return home.  CSW discussed placement process with pt, and pt ok with CSW initiating search in [place]  Pt prefers to go to either [facility] in [place] or [facility]  Unit CSW to f/u with bed offers in the am.   Assessment/plan status:  Psychosocial Support/Ongoing Assessment of Needs Other assessment/ plan:   Information/referral to community resources:    PATIENT'S/FAMILY'S RESPONSE TO PLAN OF CARE: Pt anxious to d/c and begin rehab, so that he can return home.  Pt was just getting ready to eat dinner when CSW entered room, asked CSW to turn off lights so that he could enjoy his meal and TV.

## 2014-07-30 NOTE — Progress Notes (Signed)
Occupational Therapy Treatment Patient Details Name: Antonio [name] MRN: [id number] DOB: May 24, 1936 Today's Date: 07/30/2014    History of present illness s/p Right Direct Anterior THA   OT comments  Pt not progressing as well with mobility which affects his ability to perform self care skills; thus he cannot take care of himself at home, will need SNF short term.  Follow Up Recommendations  SNF                                                                     Plan Discharge plan needs to be updated                   Evette GeorgesLeonard, Antonio [name] [phone] 07/30/2014, 11:23 AM

## 2014-07-30 NOTE — Progress Notes (Signed)
Physical Therapy Treatment Patient Details Name: Antonio MeuseJesse D [name] MRN: [id number] DOB: 1936/05/24 Today's Date: 07/30/2014    History of Present Illness s/p Right Direct Anterior THA    PT Comments    Patient improved with pain this session with gait improved after cues.  Still unsafe for going home alone due to balance issues and needing assist for self care (when voiding into toilet got on his gown, floor and socks.)  Will need SNF level rehab prior to d/c home.  Follow Up Recommendations  SNF;Supervision/Assistance - 24 hour     Equipment Recommendations  Rolling walker with 5" wheels    Recommendations for Other Services       Precautions / Restrictions Precautions Precautions: Fall Restrictions RLE Weight Bearing: Weight bearing as tolerated    Mobility  Bed Mobility         Supine to sit: Supervision;HOB elevated     General bed mobility comments: heavy use of bedrails  Transfers Overall transfer level: Needs assistance Equipment used: Rolling walker (2 wheeled) Transfers: Sit to/from Stand Sit to Stand: Min guard         General transfer comment: assist for safety, patient initially trying to pull up on walker, then able to stand pulling up on bedrails; sat with improved technique with cues  Ambulation/Gait Ambulation/Gait assistance: Supervision Ambulation Distance (Feet): 80 Feet Assistive device: Rolling walker (2 wheeled) Gait Pattern/deviations: Step-through pattern;Trunk flexed;Decreased stride length     General Gait Details: cues for posture, able to step with increased trunk/hip extension with cues, initially flexed at right hip and trunk with weight on toes in stance on right   Stairs            Wheelchair Mobility    Modified Rankin (Stroke Patients Only)       Balance Overall balance assessment: Needs assistance Sitting-balance support: Bilateral upper extremity supported Sitting balance-Leahy Scale: Poor Sitting  balance - Comments: leans back on bilat UE's and weight shifted left     Standing balance-Leahy Scale: Poor Standing balance comment: one episode of posterior loss of balance when hands off walker to don clean gown.                    Cognition Arousal/Alertness: Awake/alert Behavior During Therapy: Flat affect Overall Cognitive Status: Within Functional Limits for tasks assessed                      Exercises Total Joint Exercises Ankle Circles/Pumps: AROM;Both;10 reps;Supine Quad Sets: AROM;Right;10 reps;Supine Short Arc Quad: AROM;Right;10 reps;Supine Heel Slides: AROM;Right;10 reps;Supine Hip ABduction/ADduction: AROM;Right;10 reps;Supine    General Comments        Pertinent Vitals/Pain Pain Score: 5  Pain Location: right hip Pain Descriptors / Indicators: Burning Pain Intervention(s): Monitored during session;Repositioned    Home Living                      Prior Function            PT Goals (current goals can now be found in the care plan section) Progress towards PT goals: Progressing toward goals    Frequency  7X/week    PT Plan Discharge plan needs to be updated    Co-evaluation             End of Session Equipment Utilized During Treatment: Gait belt Activity Tolerance: Patient tolerated treatment well Patient left: in chair;with call bell/phone within reach     Time:  [id number] PT Time Calculation (min) (ACUTE ONLY): 27 min  Charges:  $Gait Training: 23-37 mins $Therapeutic Exercise: 23-37 mins                    G Codes:      WYNN,CYNDI 07/30/2014, 11:05 AM Sheran Lawlessyndi Wynn, PT [phone] 07/30/2014

## 2014-07-30 NOTE — Progress Notes (Signed)
Subjective: 2 Days Post-Op Procedure(s) (LRB): RIGHT TOTAL HIP ARTHROPLASTY ANTERIOR APPROACH (Right) Patient reports pain as moderate.  Progress made with therapy, but will likely need SNF placement due to lack of support at home.  Objective: Vital signs in last 24 hours: Temp:  [98.3 F (36.8 C)-100 F (37.8 C)] 99.8 F (37.7 C) (12/17 0612) Pulse Rate:  [69-77] 77 (12/17 0612) Resp:  [16-18] 18 (12/17 0612) BP: (113-115)/(50-60) 115/58 mmHg (12/17 0612) SpO2:  [93 %-99 %] 93 % (12/17 0612)  Intake/Output from previous day: 12/16 0701 - 12/17 0700 In: 1472.5 [P.O.:1260; I.V.:212.5] Out: 500 [Urine:500] Intake/Output this shift: Total I/O In: 300 [P.O.:300] Out: 500 [Urine:500]   Recent Labs  07/29/14 0535 07/30/14 0545  HGB 13.1 12.6*    Recent Labs  07/29/14 0535 07/30/14 0545  WBC 7.4 8.1  RBC 4.24 4.16*  HCT 38.2* 37.0*  PLT 210 181    Recent Labs  07/29/14 0535  NA 141  K 4.2  CL 103  CO2 27  BUN 14  CREATININE 0.93  GLUCOSE 102*  CALCIUM 8.4   No results for input(s): LABPT, INR in the last 72 hours.  Sensation intact distally Intact pulses distally Dorsiflexion/Plantar flexion intact Incision: dressing C/D/I  Assessment/Plan: 2 Days Post-Op Procedure(s) (LRB): RIGHT TOTAL HIP ARTHROPLASTY ANTERIOR APPROACH (Right) Up with therapy  Social Work consult to consider SNF placement - needs FL-2 on chart.  Harly Pipkins Y 07/30/2014, 7:00 AM

## 2014-07-30 NOTE — Progress Notes (Addendum)
Clinical Social Work Department CLINICAL SOCIAL WORK PLACEMENT NOTE 07/30/2014  Patient:  Antonio [name],Antonio [name]  Account Number:  [id number] Admit date:  07/28/2014  Clinical Social Worker:  Pollyann SavoyJODY DRAKE, LCSW  Date/time:  07/30/2014 10:44 PM  Clinical Social Work is seeking post-discharge placement for this patient at the following level of care:   SKILLED NURSING   (*CSW will update this form in Epic as items are completed)   07/30/2014  Patient/family provided with [facility] Department of Clinical Social Work's list of facilities offering this level of care within the geographic area requested by the patient (or if unable, by the patient's family).  07/30/2014  Patient/family informed of their freedom to choose among providers that offer the needed level of care, that participate in Medicare, Medicaid or managed care program needed by the patient, have an available bed and are willing to accept the patient.  07/30/2014  Patient/family informed of [facility]' ownership interest in [facility], as well as of the fact that they are under no obligation to receive care at this facility.  PASARR submitted to EDS on 07/30/2014 PASARR number received on 07/30/2014  FL2 transmitted to all facilities in geographic area requested by pt/family on  07/30/2014 FL2 transmitted to all facilities within larger geographic area on   Patient informed that his/her managed care company has contracts with or will negotiate with  certain facilities, including the following:     Patient/family informed of bed offers received:  07/30/2014 [facility] Physician recommends and patient chooses bed at  n/a  Patient to be transferred to  [facility] on  07/31/2014 Patient to be transferred to facility by [facility] Patient and family notified of transfer on 07/31/2014 Name of family member notified:  Patient alert and oriented x4.  Patient reports he will  make the appropriate contacts to update family.  The following physician request were entered in Epic:   Additional Comments:

## 2014-07-31 LAB — CBC
HEMATOCRIT: 34.1 % — AB (ref 39.0–52.0)
HEMOGLOBIN: 11.9 g/dL — AB (ref 13.0–17.0)
MCH: 30.8 pg (ref 26.0–34.0)
MCHC: 34.9 g/dL (ref 30.0–36.0)
MCV: 88.3 fL (ref 78.0–100.0)
Platelets: 183 10*3/uL (ref 150–400)
RBC: 3.86 MIL/uL — ABNORMAL LOW (ref 4.22–5.81)
RDW: 12.1 % (ref 11.5–15.5)
WBC: 7.2 10*3/uL (ref 4.0–10.5)

## 2014-07-31 MED ORDER — ASPIRIN 325 MG PO TBEC: 325.0000 mg | DELAYED_RELEASE_TABLET | Freq: Two times a day (BID) | ORAL | Status: AC

## 2014-07-31 MED ORDER — OXYCODONE-ACETAMINOPHEN 5-325 MG PO TABS: 1.0000 | ORAL_TABLET | ORAL | Status: DC | PRN

## 2014-07-31 MED ORDER — METHOCARBAMOL 500 MG PO TABS: 500.0000 mg | ORAL_TABLET | Freq: Four times a day (QID) | ORAL | Status: AC | PRN

## 2014-07-31 NOTE — Progress Notes (Signed)
Subjective: 3 Days Post-Op Procedure(s) (LRB): RIGHT TOTAL HIP ARTHROPLASTY ANTERIOR APPROACH (Right) Patient reports pain as moderate.  Making better progress with therapy.  Objective: Vital signs in last 24 hours: Temp:  [98.4 F (36.9 C)] 98.4 F (36.9 C) (12/18 0524) Pulse Rate:  [64] 64 (12/18 0524) Resp:  [17-20] 18 (12/18 0524) BP: (114-123)/(62-64) 114/62 mmHg (12/18 0524) SpO2:  [95 %-97 %] 95 % (12/18 0524)  Intake/Output from previous day: 12/17 0701 - 12/18 0700 In: 930 [P.O.:930] Out: 0  Intake/Output this shift: Total I/O In: 210 [P.O.:210] Out: 0    Recent Labs  07/29/14 0535 07/30/14 0545 07/31/14 0512  HGB 13.1 12.6* 11.9*    Recent Labs  07/30/14 0545 07/31/14 0512  WBC 8.1 7.2  RBC 4.16* 3.86*  HCT 37.0* 34.1*  PLT 181 183    Recent Labs  07/29/14 0535  NA 141  K 4.2  CL 103  CO2 27  BUN 14  CREATININE 0.93  GLUCOSE 102*  CALCIUM 8.4   No results for input(s): LABPT, INR in the last 72 hours.  Sensation intact distally Intact pulses distally Dorsiflexion/Plantar flexion intact Incision: scant drainage  Assessment/Plan: 3 Days Post-Op Procedure(s) (LRB): RIGHT TOTAL HIP ARTHROPLASTY ANTERIOR APPROACH (Right) Discharge to SNF today.  Antonio [name] 07/31/2014, 6:50 AM

## 2014-07-31 NOTE — Clinical Social Work Note (Addendum)
Pt to discharge today to [facility] RN to call report prior to transportation to: [phone] Transportation: [facility]- called at 11:48am  CSW reviewed dc plans with patient who was alert and oriented.  CSW sent dc summary to SNF and packet is complete.  RN aware.  Vickii PennaGina [place]aco [place], LCSWA [phone]  Psychiatric & Orthopedics (5N 1-16) Clinical Social Worker

## 2014-07-31 NOTE — Discharge Summary (Signed)
Patient ID: Antonio MeuseJesse D Cashion MRN: [id number] DOB/AGE: 1936-08-06 78 y.o.  Admit date: 07/28/2014 Discharge date: 07/31/2014  Admission Diagnoses:  Principal Problem:   Primary osetoarthritis of left hip Active Problems:   Status post total replacement of right hip   Discharge Diagnoses:  Same  Past Medical History  Diagnosis Date  . Arthritis     Surgeries: Procedure(s): RIGHT TOTAL HIP ARTHROPLASTY ANTERIOR APPROACH on 07/28/2014   Consultants:    Discharged Condition: Improved  Hospital Course: Antonio [name] is an 78 y.o. male who was admitted 07/28/2014 for operative treatment ofArthritis of left hip. Patient has severe unremitting pain that affects sleep, daily activities, and work/hobbies. After pre-op clearance the patient was taken to the operating room on 07/28/2014 and underwent  Procedure(s): RIGHT TOTAL HIP ARTHROPLASTY ANTERIOR APPROACH.    Patient was given perioperative antibiotics: Anti-infectives    Start     Dose/Rate Route Frequency Ordered Stop   07/28/14 2200  ceFAZolin (ANCEF) IVPB 1 g/50 mL premix     1 g100 mL/hr over 30 Minutes Intravenous Every 6 hours 07/28/14 2042 07/29/14 0400   07/28/14 0600  ceFAZolin (ANCEF) IVPB 2 g/50 mL premix     2 g100 mL/hr over 30 Minutes Intravenous On call to O.R. 07/27/14 1359 07/28/14 1615       Patient was given sequential compression devices, early ambulation, and chemoprophylaxis to prevent DVT.  Patient benefited maximally from hospital stay and there were no complications.    Recent vital signs: Patient Vitals for the past 24 hrs:  BP Temp Temp src Pulse Resp SpO2  07/31/14 0524 114/62 mmHg 98.4 F (36.9 C) Oral 64 18 95 %  07/31/14 0400 - - - - 20 97 %  07/31/14 0000 - - - - 18 97 %  07/30/14 2047 123/64 mmHg 98.4 F (36.9 C) Oral 64 17 97 %  07/30/14 2000 - - - - 18 97 %  07/30/14 1600 - - - - 18 -  07/30/14 1200 - - - - 18 -  07/30/14 0800 - - - - 18 -     Recent laboratory studies:   Recent Labs  07/29/14 0535 07/30/14 0545 07/31/14 0512  WBC 7.4 8.1 7.2  HGB 13.1 12.6* 11.9*  HCT 38.2* 37.0* 34.1*  PLT 210 181 183  NA 141  --   --   K 4.2  --   --   CL 103  --   --   CO2 27  --   --   BUN 14  --   --   CREATININE 0.93  --   --   GLUCOSE 102*  --   --   CALCIUM 8.4  --   --      Discharge Medications:     Medication List    TAKE these medications        aspirin 325 MG EC tablet  Take 1 tablet (325 mg total) by mouth 2 (two) times daily after a meal.     methocarbamol 500 MG tablet  Commonly known as:  ROBAXIN  Take 1 tablet (500 mg total) by mouth every 6 (six) hours as needed for muscle spasms.     oxyCODONE-acetaminophen 5-325 MG per tablet  Commonly known as:  ROXICET  Take 1-2 tablets by mouth every 4 (four) hours as needed for severe pain.        Diagnostic Studies: Dg Hip Operative Right  07/28/2014   CLINICAL DATA:  Arthritis, hip replacement  EXAM: DG OPERATIVE RIGHT HIP 1-2 VIEWS  TECHNIQUE: Fluoroscopic spot image(s) were submitted for interpretation post-operatively.  COMPARISON:  None.  FINDINGS: Four digital C-arm fluoroscopic images submitted.  Osteoarthritic changes of the RIGHT hip noted.  Images demonstrate placement of acetabular and femoral components of a RIGHT hip prosthesis in expected positions.  No fracture or dislocation seen.  IMPRESSION: RIGHT hip prosthesis without acute complication.   Electronically Signed   By: Ulyses SouthwardMark  Boles M.D.   On: 07/28/2014 17:57   Dg Pelvis Portable  07/28/2014   CLINICAL DATA:  78 year old male status post right hip arthroplasty.  EXAM: PORTABLE PELVIS 1-2 VIEWS; PORTABLE RIGHT HIP - 1 VIEW  COMPARISON:  Intraoperative films 07/28/2014.  FINDINGS: Three views of the bony pelvis and the right hip demonstrate postoperative changes of right total hip arthroplasty. The femoral and acetabular components of the prosthesis appear properly seated, without periprosthetic fracture or other acute  abnormality. Femoral head is located. Extensive heterotopic ossification inferior to the right acetabulum. There is a small amount of gas in the joint space and overlying soft tissues.  IMPRESSION: 1. Postoperative changes of right total hip arthroplasty without acute complicating features.   Electronically Signed   By: Trudie Reedaniel  Entrikin M.D.   On: 07/28/2014 19:32   Dg Hip Portable 1 View Right  07/28/2014   CLINICAL DATA:  78 year old male status post right hip arthroplasty.  EXAM: PORTABLE PELVIS 1-2 VIEWS; PORTABLE RIGHT HIP - 1 VIEW  COMPARISON:  Intraoperative films 07/28/2014.  FINDINGS: Three views of the bony pelvis and the right hip demonstrate postoperative changes of right total hip arthroplasty. The femoral and acetabular components of the prosthesis appear properly seated, without periprosthetic fracture or other acute abnormality. Femoral head is located. Extensive heterotopic ossification inferior to the right acetabulum. There is a small amount of gas in the joint space and overlying soft tissues.  IMPRESSION: 1. Postoperative changes of right total hip arthroplasty without acute complicating features.   Electronically Signed   By: Trudie Reedaniel  Entrikin M.D.   On: 07/28/2014 19:32    Disposition:   To skilled nursing facility      Discharge Instructions    Call MD / Call 911    Complete by:  As directed   If you experience chest pain or shortness of breath, CALL 911 and be transported to the hospital emergency room.  If you develope a fever above 101 F, pus (white drainage) or increased drainage or redness at the wound, or calf pain, call your surgeon's office.     Constipation Prevention    Complete by:  As directed   Drink plenty of fluids.  Prune juice may be helpful.  You may use a stool softener, such as Colace (over the counter) 100 mg twice a day.  Use MiraLax (over the counter) for constipation as needed.     Diet - low sodium heart healthy    Complete by:  As directed       Discharge instructions    Complete by:  As directed   Increase activities as comfort allows; full weight bearing right hip; no hip precautions. Can get current hip dressing wet daily in the shower. Try to leave current hip dressing alone and in place until outpatient ortho follow-up     Discharge patient    Complete by:  As directed      Increase activity slowly as tolerated    Complete by:  As directed  Follow-up Information    Follow up with [facility].   Why:  Someone from [facility] will contact you concerning start date and time for physical therapy.   Contact information:   [address] [place] [address] [phone]       Follow up with Kathryne Hitch, MD In 2 weeks.   Specialty:  Orthopedic Surgery   Contact information:   [address] [place] [address] [phone]        Signed: Kathryne Hitch 07/31/2014, 6:53 AM

## 2014-07-31 NOTE — Progress Notes (Signed)
PT Cancellation Note  Patient Details Name: Nelida MeuseJesse D Glandon MRN: [id number] DOB: 1936-04-21   Cancelled Treatment:    Reason Eval/Treat Not Completed: Other (comment); patient ready for d/c to SNF.  States thought he'd be leaving soon.  Wishes to wait till tomorrow for further therapy.   Giovanni Biby,CYNDI 07/31/2014, 12:39 PM

## 2014-07-31 NOTE — Progress Notes (Signed)
Gave report on patient to Cass[facility] at 11:55 AM.

## 2014-10-12 ENCOUNTER — Encounter: Payer: Self-pay | Admitting: Gastroenterology

## 2014-12-06 NOTE — H&P (Signed)
PATIENT NAME:  Antonio [name], Antonio [name] MR#:  [id number] DATE OF BIRTH:  1936-04-03  DATE OF ADMISSION:  09/16/2011  ADMITTING PHYSICIAN: Dr. Michela PitcherEly.   CHIEF COMPLAINT: Abdominal pain.   BRIEF HISTORY: Antonio [name] is a 79 year old gentleman who was seen in the Emergency Room with a week's history of significant abdominal pain. A week ago on the 26th he had marked periumbilical lower abdominal pain which was quite severe associated with some nausea and anorexia. He did not vomit. The pain improved on the 27th, but persisted onto the end of the 28th, so he attempted to find a physician who would see him. He was unable to find anyone to evaluate him until Friday, 02/01. He was treated for possible reflux esophagitis and gastritis with PPI drugs. He continued to have low-grade fever, anorexia, and marked abdominal pain. This morning his pain was worse. He presented to the Emergency Room for further evaluation.   He denies any previous similar symptoms. He does not have any history of gastrointestinal problems including hepatitis, yellow jaundice, pancreatitis, peptic ulcer disease, gallbladder disease, or diverticulitis. He has had a colonoscopy five years ago which was unremarkable. He has had no other surgical history and has no major medical problems. He denies any history of cardiac disease, hypertension, or diabetes. He has no medical allergies and takes no medications regularly.   SOCIAL HISTORY: He does not smoke or drink alcohol. He is a retired Emergency planning/management officerpolice officer.   REVIEW OF SYSTEMS: Negative across the board.   PHYSICAL EXAMINATION:  GENERAL: He is an alert, pleasant gentleman in no significant distress.   VITALS: His blood pressure is 128/74, heart rate 82 and regular. His oxygen saturation is stable and he is afebrile.   HEENT: Unremarkable. He has equally round pupils and no scleral icterus.   NECK: Supple without adenopathy and he has a midline trach.   CHEST: Clear bilaterally with no  adventitious sounds. He has normal pulmonary excursion.   CARDIAC: No murmurs or gallops to my ear. He seems to be in normal sinus rhythm.   ABDOMEN: Markedly tender in the lower abdomen, primarily in the suprapubic area with point tenderness, rebound, and guarding. He has hypoactive but present bowel sounds. No masses are noted.   LOWER EXTREMITIES: Full range of motion. Good distal pulses and no deformities.   PSYCHIATRIC: Normal mood and orientation.   LABORATORY, RADIOLOGICAL AND DIAGNOSTIC DATA: Work-up in the Emergency Room identified a normal abdominal ultrasound, but a CT scan with a right lower quadrant, right mid abdomen abscess with a dilated appendix and appendicolith suspicious for acute ruptured appendicitis with abscess. The surgical service was consulted.   ASSESSMENT AND PLAN: I have independently reviewed his CT scan. He does appear to have an abdominal abscess suspicious for ruptured appendicitis. He does not have any history of other gastrointestinal problems and with his negative colonoscopy, I doubt that he has a significant malignant potential. In this situation, I would recommend surgical exploration with laparoscopy, possible open appendectomy with placement of a drain and significant irrigation. This plan has been discussed with the patient in detail and he is in agreement. Risks, benefits, and options have been outlined and accepted.   ____________________________ Carmie Endalph L. Ely III, MD rle:ap [name]: 09/16/2011 14:54:53 ET T: 09/16/2011 15:21:22 ET JOB#: [id number]  cc: Quentin Orealph L. Ely III, MD, <Dictator> Quentin OreALPH L ELY MD ELECTRONICALLY SIGNED 09/24/2011 21:14

## 2014-12-06 NOTE — Op Note (Signed)
PATIENT NAME:  Antonio [name], Antonio [name] MR#:  [id number] DATE OF BIRTH:  11/08/1935  DATE OF PROCEDURE:  09/16/2011  PREOPERATIVE DIAGNOSIS: Ruptured appendicitis with abscess.   POSTOPERATIVE DIAGNOSIS: Ruptured appendicitis with abscess.   PROCEDURES PERFORMED: 1. Laparoscopy.  2. Open appendectomy and drainage of pelvic abscess.   SURGEON: Quentin Orealph L. Ely, MD  ANESTHESIA: General.   OPERATIVE PROCEDURE: With the patient in the supine position after induction of appropriate general anesthesia, the patient's abdomen was prepped with ChloraPrep and draped with sterile towels. The patient was placed in the head down, feet up position. A small infraumbilical incision was made in the standard fashion and carried down bluntly through the subcutaneous tissue. The Veress needle was used to cannulate the peritoneal cavity. CO2 was insufflated to appropriate pressure measurements. When approximately 2.5 liters of CO2 were instilled, the Veress needle was withdrawn and an 11 mm applied medical port was inserted into the peritoneal cavity. Intraperitoneal position was confirmed and CO2 was reinsufflated. A large inflammatory mass was identified in the right midabdomen. A right upper quadrant transverse incision was made and an 11 mm port was inserted under direct vision. A left lower quadrant transverse incision was made and a 12 mm port was inserted under direct vision. The camera was moved to the upper port and dissection begun through the two lower ports. I could not separate the loops of bowel in the right lower quadrant and actually was concerned about two serosal tears I made on the small bowel trying to get better access and purchase on the right lower quadrant. The cecum was easily identified, but the inflammatory mass appeared to be locked in between several loops of small bowel and I could not separate the loops successfully or safely. I made the decision to abandon the procedure and move to an open  appendectomy. The abdomen was desufflated and a lower midline incision was made from the umbilicus to the suprapubic area. It was carried down through the subcutaneous tissue with Bovie electrocautery. The midline fascia was identified and opened in line with the skin incision as was the peritoneum. The inflammatory mass was easily palpable and identifiable. With manual palpation and dissection the loops of bowel were all separated from the inflammatory mass draining approximately 75 mL of creamy white pus. It was foul-smelling. There was a small serosal tear in the mid jejunum and another full thickness tear in the mid jejunum, both of which were repaired without difficulty. The full-thickness tear was repaired with a two layer of 3-0 Vicryl and 3-0 silk seromuscular sutures and the serosal tear was closed with 3-0 silk seromuscular sutures. The appendix was identified in the base of the abscess. It was stuck down and adherent to the lateral wall of the pelvis. Tedious dissection was required to expose it. The base of the appendix was identified and divided with an application of the Endo GIA stapling device carrying a blue load. The mesoappendix was divided with three applications of the Endo GIA stapler carrying a white load. The appendix appeared to be ruptured and was draining purulence from the midportion of the appendix. It was passed off the table. The area was then irrigated with 4 liters of warm saline solution. Because the abscess was localized I placed a JP drain down the right gutter into the abscess cavity and brought it out through the right upper quadrant stab wound. It was secured with 3-0 nylon. Bowel contents were returned to their anatomic position after reviewing the two bowel  injuries without evidence of any significant problem. Midline fascia was closed with interrupted sutures of #1 Prolene, a Penrose drain was placed at the base of the wound, and the skin clipped. Sterile dressings were  applied. The patient was returned to the Recovery Room having tolerated the procedure well. Sponge, instrument, and needle counts were correct x2 in the Operating Room.  ____________________________ Quentin Ore III, MD rle:slb [name]: 09/16/2011 17:32:59 ET T: 09/17/2011 10:27:11 ET JOB#: [id number]  cc: Quentin Ore III, MD, <Dictator> Quentin Ore MD ELECTRONICALLY SIGNED 09/24/2011 21:14

## 2014-12-06 NOTE — Discharge Summary (Signed)
PATIENT NAME:  Antonio [name], Antonio [name] MR#:  [id number] DATE OF BIRTH:  1936/08/01  DATE OF ADMISSION:  09/16/2011 DATE OF DISCHARGE:  09/26/2011  BRIEF HISTORY: [name] is a 79 year old gentleman seen in the Emergency Room with abdominal pain for at least a week. He had fairly significant abdominal discomfort and had sought attention for several days. He had been treated with outpatient antibiotics without any improvement in his symptoms. He presented to the Emergency Room for further evaluation. He had elevated white blood cell count and CT scan revealed a large right midabdomen abscess consistent with possible ruptured appendicitis. After appropriate preoperative preparation and informed consent, he was taken to surgery on the evening of 09/16/2011 and underwent attempted laparoscopic appendectomy. The appendix could not be mobilized. The abscess was encapsulated in several loops of small bowel. The procedure was converted to an open procedure where the abscess was drained and cultured. The appendix appeared to be ruptured and necrotic. It was removed and the stump of the appendix divided with a stapler. The area was copiously irrigated. A drain was placed in the site of the abscess cavity. He had very slow return of bowel function. He had prolonged postoperative ileus and was treated with IV antibiotics for 10 days. He was discharged once he developed bowel function. He was started on a liquid diet and advanced to a soft diet without difficulty. He was discharged home on the 12th to be followed in the office in 7 to 10 days' time.   DISCHARGE MEDICATIONS:  1. Cipro 500 mg p.o. b.i.[name]. for five days. 2. Clindamycin 300 mg p.o. q.8 hours for five days.  3. Norco 5/325 1 to 2 tabs every six hours p.r.n. pain.  4. He did not have any regular home medications.   FINAL DISCHARGE DIAGNOSIS: Acute ruptured appendicitis.   SURGERY: Appendectomy.  ____________________________ Carmie Endalph L. Ely III,  MD rle:drc [name]: 10/02/2011 17:31:53 ET T: 10/03/2011 10:49:32 ET JOB#: [id number]  cc: Quentin Orealph L. Ely III, MD, <Dictator> Quentin OreALPH L ELY MD ELECTRONICALLY SIGNED 10/04/2011 9:57

## 2015-04-01 ENCOUNTER — Encounter: Payer: Self-pay | Admitting: Gastroenterology

## 2016-01-18 IMAGING — RF DG HIP OPERATIVE*R*
1 series · 4 of 4 positions shown · non-contrast
Comparison: None.

CLINICAL DATA: Arthritis, hip replacement

EXAM:
DG OPERATIVE RIGHT HIP 1-2 VIEWS
TECHNIQUE: Fluoroscopic spot image(s) were submitted for interpretation
post-operatively.

[Series 1: run · 4 of 4 slices shown]
[im 1/4]
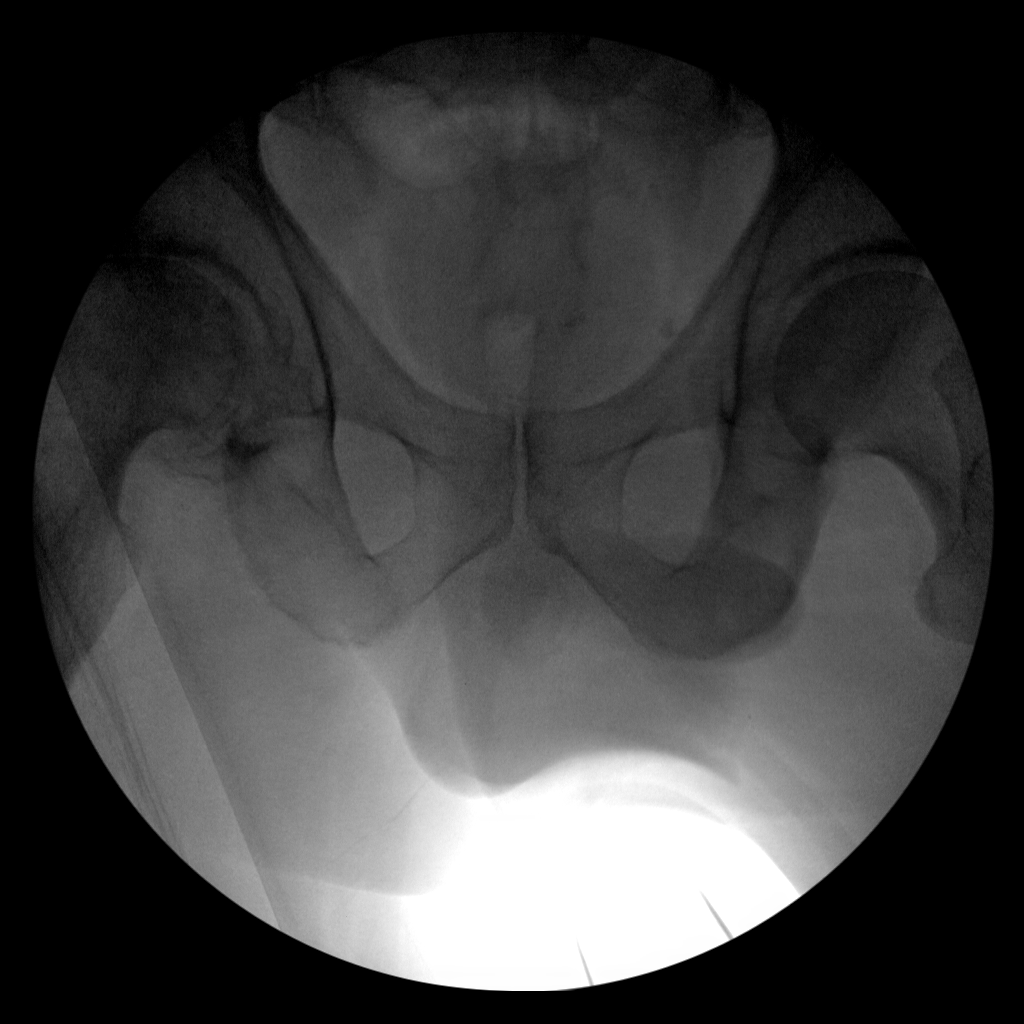
[im 2/4]
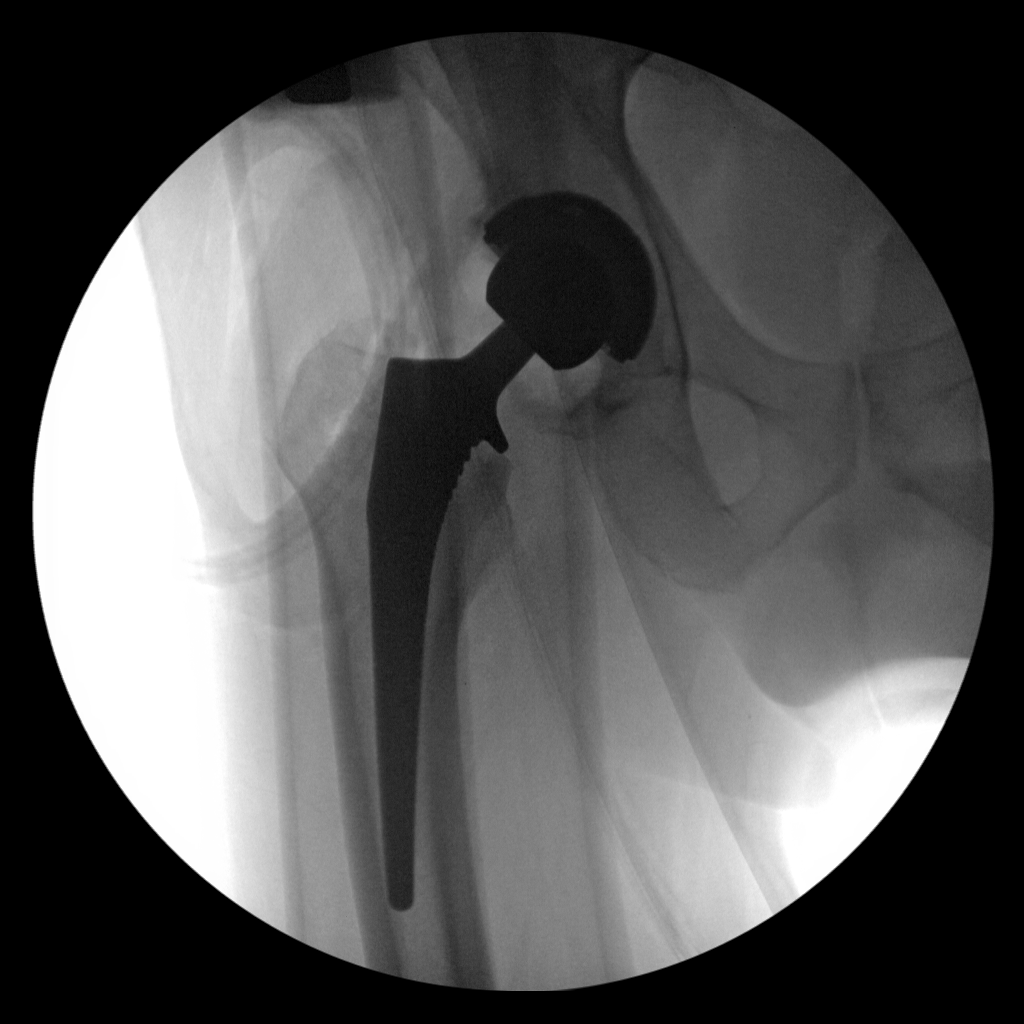
[im 3/4]
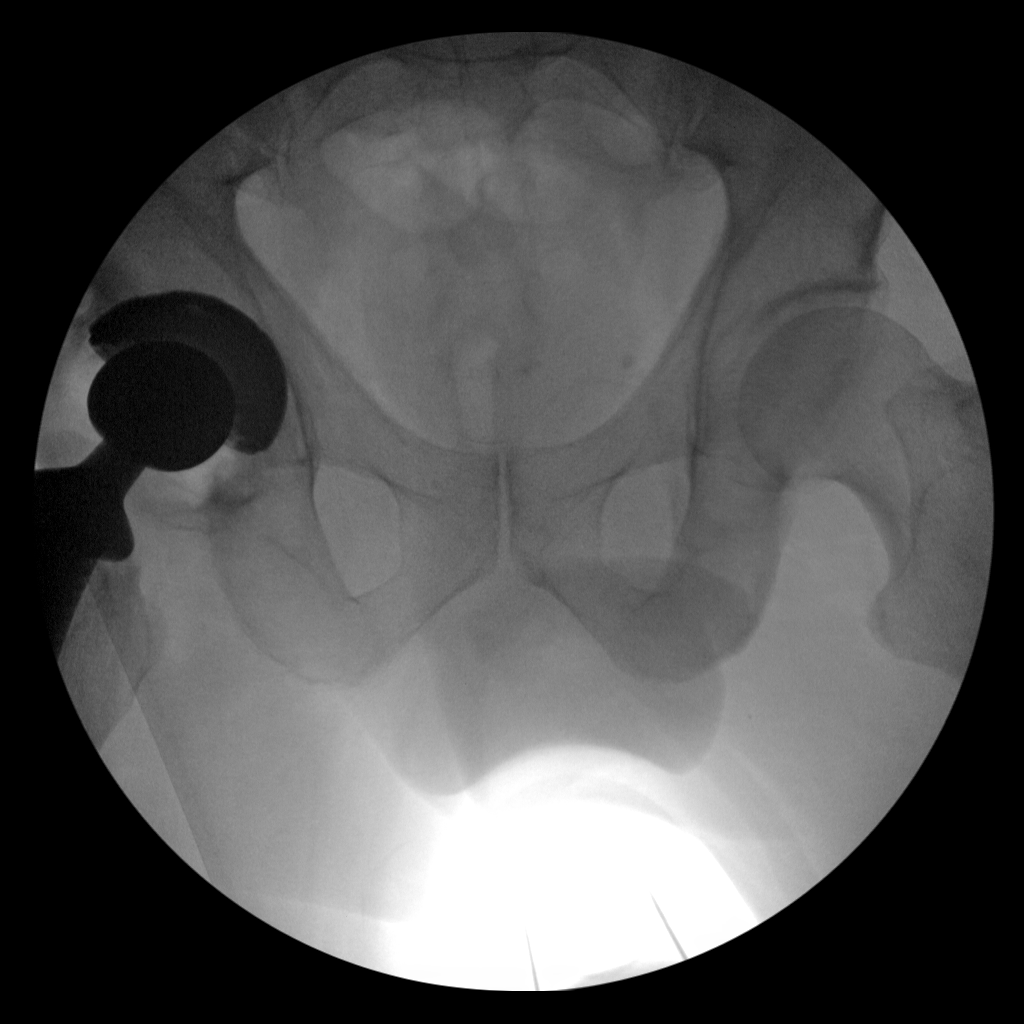
[im 4/4]
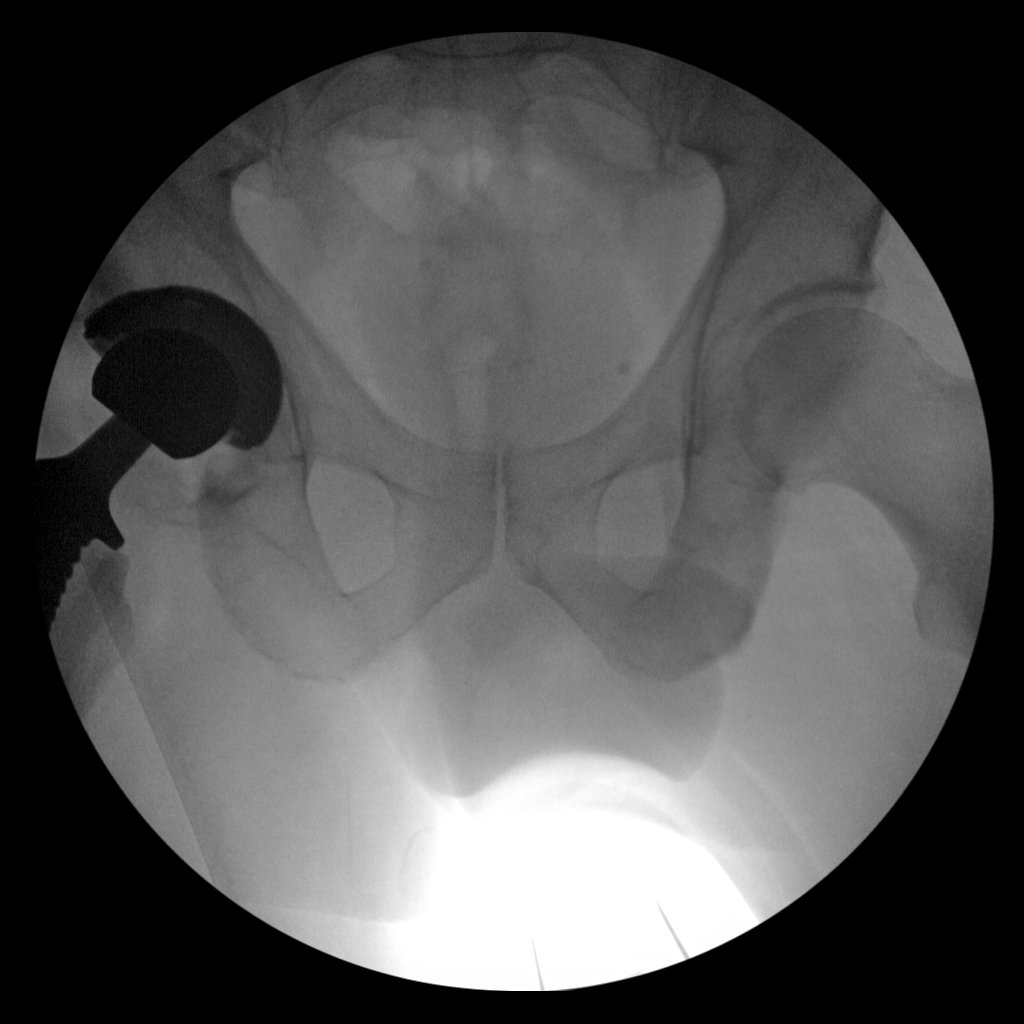

[4 of 4 positions shown; findings below may reference images not displayed]

FINDINGS: Four digital C-arm fluoroscopic images submitted.

Osteoarthritic changes of the RIGHT hip noted.

Images demonstrate placement of acetabular and femoral components of
a RIGHT hip prosthesis in expected positions.

No fracture or dislocation seen.
IMPRESSION: RIGHT hip prosthesis without acute complication.

## 2018-02-01 ENCOUNTER — Emergency Department: Payer: Medicare Other

## 2018-02-01 ENCOUNTER — Emergency Department
Admission: EM | Admit: 2018-02-01 | Discharge: 2018-02-01 | Disposition: A | Discharge status: Home / Self Care | Payer: Medicare Other | Attending: Emergency Medicine | Admitting: Emergency Medicine

## 2018-02-01 ENCOUNTER — Encounter: Payer: Self-pay | Admitting: Emergency Medicine

## 2018-02-01 ENCOUNTER — Other Ambulatory Visit: Payer: Self-pay

## 2018-02-01 DIAGNOSIS — Z7982 Long term (current) use of aspirin: Secondary | ICD-10-CM | POA: Diagnosis not present

## 2018-02-01 DIAGNOSIS — R1031 Right lower quadrant pain: Secondary | ICD-10-CM | POA: Diagnosis present

## 2018-02-01 DIAGNOSIS — R109 Unspecified abdominal pain: Secondary | ICD-10-CM

## 2018-02-01 DIAGNOSIS — Z96641 Presence of right artificial hip joint: Secondary | ICD-10-CM | POA: Diagnosis not present

## 2018-02-01 LAB — CBC WITH DIFFERENTIAL/PLATELET
Basophils Absolute: 0 10*3/uL (ref 0–0.1)
Basophils Relative: 1 %
EOS PCT: 2 %
Eosinophils Absolute: 0.1 10*3/uL (ref 0–0.7)
HCT: 45.3 % (ref 40.0–52.0)
Hemoglobin: 15.8 g/dL (ref 13.0–18.0)
LYMPHS ABS: 1.7 10*3/uL (ref 1.0–3.6)
LYMPHS PCT: 25 %
MCH: 32.1 pg (ref 26.0–34.0)
MCHC: 34.8 g/dL (ref 32.0–36.0)
MCV: 92.1 fL (ref 80.0–100.0)
MONOS PCT: 10 %
Monocytes Absolute: 0.7 10*3/uL (ref 0.2–1.0)
NEUTROS ABS: 4.4 10*3/uL (ref 1.4–6.5)
Neutrophils Relative %: 62 %
Platelets: 273 10*3/uL (ref 150–440)
RBC: 4.91 MIL/uL (ref 4.40–5.90)
RDW: 13.4 % (ref 11.5–14.5)
WBC: 7 10*3/uL (ref 3.8–10.6)

## 2018-02-01 LAB — URINALYSIS, ROUTINE W REFLEX MICROSCOPIC
Bacteria, UA: NONE SEEN
Bilirubin Urine: NEGATIVE
GLUCOSE, UA: NEGATIVE mg/dL
Ketones, ur: NEGATIVE mg/dL
NITRITE: NEGATIVE
PH: 5 (ref 5.0–8.0)
PROTEIN: NEGATIVE mg/dL
Specific Gravity, Urine: 1.028 (ref 1.005–1.030)

## 2018-02-01 LAB — HEPATIC FUNCTION PANEL
ALT: 19 U/L (ref 17–63)
AST: 26 U/L (ref 15–41)
Albumin: 4.1 g/dL (ref 3.5–5.0)
Alkaline Phosphatase: 75 U/L (ref 38–126)
BILIRUBIN DIRECT: 0.1 mg/dL (ref 0.1–0.5)
Indirect Bilirubin: 0.5 mg/dL (ref 0.3–0.9)
Total Bilirubin: 0.6 mg/dL (ref 0.3–1.2)
Total Protein: 6.8 g/dL (ref 6.5–8.1)

## 2018-02-01 LAB — BASIC METABOLIC PANEL
ANION GAP: 6 (ref 5–15)
BUN: 19 mg/dL (ref 6–20)
CHLORIDE: 107 mmol/L (ref 101–111)
CO2: 25 mmol/L (ref 22–32)
Calcium: 9.1 mg/dL (ref 8.9–10.3)
Creatinine, Ser: 0.85 mg/dL (ref 0.61–1.24)
GFR calc Af Amer: >60 mL/min (ref >60)
GLUCOSE: 109 mg/dL — AB (ref 65–99)
Potassium: 3.9 mmol/L (ref 3.5–5.1)
Sodium: 138 mmol/L (ref 135–145)

## 2018-02-01 LAB — LIPASE, BLOOD: LIPASE: 22 U/L (ref 11–51)

## 2018-02-01 LAB — TROPONIN I: Troponin I: <0.03 ng/mL (ref <0.03)

## 2018-02-01 MED ORDER — KETOROLAC TROMETHAMINE 30 MG/ML IJ SOLN
15.0000 mg | Freq: Once | INTRAMUSCULAR | Status: AC
  Administered 2018-02-01: 15 mg via INTRAVENOUS
  Filled 2018-02-01: qty 1

## 2018-02-01 MED ORDER — IOPAMIDOL (ISOVUE-300) INJECTION 61%
100.0000 mL | Freq: Once | INTRAVENOUS | Status: AC | PRN
  Administered 2018-02-01: 100 mL via INTRAVENOUS

## 2018-02-01 MED ORDER — OXYCODONE-ACETAMINOPHEN 5-325 MG PO TABS
2.0000 | ORAL_TABLET | Freq: Once | ORAL | Status: AC
  Administered 2018-02-01: 2 via ORAL
  Filled 2018-02-01: qty 2

## 2018-02-01 MED ORDER — OXYCODONE-ACETAMINOPHEN 5-325 MG PO TABS: 1.0000 | ORAL_TABLET | Freq: Four times a day (QID) | ORAL | 0 refills | Status: AC | PRN

## 2018-02-01 MED ORDER — DOCUSATE SODIUM 100 MG PO CAPS: ORAL_CAPSULE | ORAL | 0 refills | Status: AC

## 2018-02-01 NOTE — ED Triage Notes (Signed)
Patient ambulatory to triage with steady gait, without difficulty or distress noted; pt reports right flank pain radiating into right side abd since Wed, denies accomp symptoms; denies hx of same

## 2018-02-01 NOTE — ED Provider Notes (Signed)
-----------------------------------------   8:12 AM on 02/01/2018 -----------------------------------------   Blood pressure (!) 151/76, pulse (!) 57, temperature 98.1 F (36.7 C), temperature source Oral, resp. rate 18, height 5\' 11"  (1.803 m), weight 90.7 kg (200 lb), SpO2 96 %.  Assuming care from Dr. York CeriseForbach of [facility] is a 82 y.o. male with a chief complaint of Abdominal Pain .    Please refer to H&P by previous MD for further details.  The current plan of care is to f/u LFTs, lipase, and troponin which were all unremarkable. Patient will be discharged per Dr. Scharlene CornForbach's plan with his instructions for follow up and outpatient management.      Don PerkingVeronese, [place], MD 02/01/18 [phone]

## 2018-02-01 NOTE — ED Provider Notes (Addendum)
[facility] Emergency Department Provider Note  ____________________________________________   First MD Initiated Contact with Patient 02/01/18 [phone]     (approximate)  I have reviewed the triage vital signs and the nursing notes.   HISTORY  Chief Complaint Abdominal Pain    HPI Antonio [name] is a 82 y.o. male who is generally in very good health and only infrequently goes to a doctor.  He presents for evaluation of about 2 days of pain in the right flank that radiates down and around into the right inguinal region.  He states that the pain was acute in onset and waxes and wanes, sometimes going away completely but always coming back but nothing particular made or worse.  It is a sharp and aching pain that he has not experienced in the past.  He denies fever/chills, chest pain, shortness of breath, nausea, vomiting, abdominal pain except pain radiating around from the right flank.  He has had no dysuria no hematuria.  Although the pain radiates down into his groin he is having no pain in his penis or his testicles.  He has sustained no trauma or injury of which she is aware.  He has no history of kidney stones.  He has no history of blood clots and no cardiac history.  Past Medical History:  Diagnosis Date  . Arthritis     Patient Active Problem List   Diagnosis Date Noted  . Primary osetoarthritis of left hip 07/28/2014  . Status post total replacement of right hip 07/28/2014    Past Surgical History:  Procedure Laterality Date  . APPENDECTOMY    . TOTAL HIP ARTHROPLASTY Right 07/28/2014   Procedure: RIGHT TOTAL HIP ARTHROPLASTY ANTERIOR APPROACH;  Surgeon: Kathryne Hitch, MD;  Location: [facility] OR;  Service: Orthopedics;  Laterality: Right;    Prior to Admission medications   Medication Sig Start Date End Date Taking? Authorizing Provider  aspirin EC 325 MG EC tablet Take 1 tablet (325 mg total) by mouth 2 (two) times daily after a meal.  07/31/14   Kathryne Hitch, MD  docusate sodium (COLACE) 100 MG capsule Take 1 tablet once or twice daily as needed for constipation while taking narcotic pain medicine 02/01/18   Loleta Rose, MD  methocarbamol (ROBAXIN) 500 MG tablet Take 1 tablet (500 mg total) by mouth every 6 (six) hours as needed for muscle spasms. 07/31/14   Kathryne Hitch, MD  oxyCODONE-acetaminophen (PERCOCET) 5-325 MG tablet Take 1-2 tablets by mouth every 6 (six) hours as needed for severe pain. 02/01/18   Loleta Rose, MD    Allergies Patient has no known allergies.  No family history on file.  Social History Social History   Tobacco Use  . Smoking status: Never Smoker  . Smokeless tobacco: Never Used  Substance Use Topics  . Alcohol use: No  . Drug use: No    Review of Systems Constitutional: No fever/chills Eyes: No visual changes. ENT: No sore throat. Cardiovascular: Denies chest pain. Respiratory: Denies shortness of breath. Gastrointestinal: No abdominal pain except for some pain radiating around from the right flank as described above.  No nausea, no vomiting.  No diarrhea.  No constipation. Genitourinary: Negative for dysuria. Musculoskeletal: Right sided flank pain radiating around to the right groin as described above. Integumentary: Negative for rash. Neurological: Negative for headaches, focal weakness or numbness.   ____________________________________________   PHYSICAL EXAM:  VITAL SIGNS: ED Triage Vitals  Enc Vitals Group     BP  02/01/18 0351 (!) 179/91     Pulse Rate 02/01/18 0351 74     Resp 02/01/18 0351 18     Temp 02/01/18 0351 98.1 F (36.7 C)     Temp Source 02/01/18 0351 Oral     SpO2 02/01/18 0351 95 %     Weight 02/01/18 0349 90.7 kg (200 lb)     Height 02/01/18 0349 1.803 m (5\' 11" )     Head Circumference --      Peak Flow --      Pain Score 02/01/18 0349 5     Pain Loc --      Pain Edu? --      Excl. in GC? --     Constitutional: Alert  and oriented. Well appearing and in no acute distress. Eyes: Conjunctivae are normal.  Head: Atraumatic. Nose: No congestion/rhinnorhea. Mouth/Throat: Mucous membranes are moist. Neck: No stridor.  No meningeal signs.   Cardiovascular: Normal rate, regular rhythm. Good peripheral circulation. Grossly normal heart sounds. Respiratory: Normal respiratory effort.  No retractions. Lungs CTAB. Gastrointestinal: Soft and nontender. No distention.  GU: Normal uncircumcised external male genitalia.  No tenderness to palpation of the penis.  No swelling of the scrotum, no palpable inguinal hernia, no epididymal swelling or tenderness, no tenderness to palpation of the testes. Musculoskeletal: No CVA tenderness to percussion. no lower extremity tenderness nor edema. No gross deformities of extremities. Neurologic:  Normal speech and language. No gross focal neurologic deficits are appreciated.  Skin:  Skin is warm, dry and intact. No rash noted. Psychiatric: Mood and affect are normal. Speech and behavior are normal.  ____________________________________________   LABS (all labs ordered are listed, but only abnormal results are displayed)  Labs Reviewed  URINALYSIS, ROUTINE W REFLEX MICROSCOPIC - Abnormal; Notable for the following components:      Result Value   Color, Urine YELLOW (*)    APPearance CLEAR (*)    Hgb urine dipstick SMALL (*)    Leukocytes, UA TRACE (*)    All other components within normal limits  BASIC METABOLIC PANEL - Abnormal; Notable for the following components:   Glucose, Bld 109 (*)    All other components within normal limits  CBC WITH DIFFERENTIAL/PLATELET  HEPATIC FUNCTION PANEL  LIPASE, BLOOD  TROPONIN I   ____________________________________________  EKG  None - EKG not ordered by ED physician ____________________________________________  RADIOLOGY Marylou Mccoy, personally discussed these images and results by phone with the on-call radiologist and  used this discussion as part of my medical decision making.    ED MD interpretation: No acute abnormalities identified on CT scans, including no evidence of ureteral stones, renal infarction, nephritis, etc.  Official radiology report(s): Ct Abdomen Pelvis W Contrast  Result Date: 02/01/2018 CLINICAL DATA:  82 y/o M; right flank pain radiating to the right side. EXAM: CT ABDOMEN AND PELVIS WITH CONTRAST TECHNIQUE: Multidetector CT imaging of the abdomen and pelvis was performed using the standard protocol following bolus administration of intravenous contrast. CONTRAST:  ISOVUE-300 IOPAMIDOL (ISOVUE-300) INJECTION 61% COMPARISON:  02/01/2018 CT abdomen and pelvis. FINDINGS: Lower chest: No acute abnormality. Hepatobiliary: No focal liver abnormality is seen. No gallstones, gallbladder wall thickening, or biliary dilatation. Pancreas: Unremarkable. No pancreatic ductal dilatation or surrounding inflammatory changes. Spleen: Normal in size without focal abnormality. Adrenals/Urinary Tract: Adrenal glands are unremarkable. Kidneys are normal, without renal calculi, focal lesion, or hydronephrosis. Bladder is unremarkable. Stomach/Bowel: Stomach is within normal limits. Appendectomy. No evidence of bowel wall thickening,  distention, or inflammatory changes. Vascular/Lymphatic: Aortic atherosclerosis. No enlarged abdominal or pelvic lymph nodes. Reproductive: Moderate prostate enlargement. Other: No abdominal wall hernia or abnormality. No abdominopelvic ascites. Stable postsurgical changes within the lower ventral anterior abdominal wall. Musculoskeletal: Right hip arthroplasty postsurgical changes. Heterotopic ossification medial to the hip. No acute osseous abnormality. Advanced degenerative changes of the spine. Right L3-4 disc extrusion with superior migration (series 6, image 65). IMPRESSION: 1. Advanced lumbar spine degenerative changes. Right L3-4 disc extrusion with superior migration. 2. Moderate  prostate enlargement. 3. Aortic atherosclerosis. 4. No urinary stone disease or hydronephrosis. Electronically Signed   By: Mitzi Hansen M.D.   On: 02/01/2018 06:59   Ct Renal Stone Study  Result Date: 02/01/2018 CLINICAL DATA:  Right flank pain radiating to the right side since Wednesday. EXAM: CT ABDOMEN AND PELVIS WITHOUT CONTRAST TECHNIQUE: Multidetector CT imaging of the abdomen and pelvis was performed following the standard protocol without IV contrast. COMPARISON:  09/16/2011 FINDINGS: Lower chest: Lung bases are clear. Hepatobiliary: No focal liver abnormality is seen. No gallstones, gallbladder wall thickening, or biliary dilatation. Pancreas: Unremarkable. No pancreatic ductal dilatation or surrounding inflammatory changes. Spleen: Normal in size without focal abnormality. Adrenals/Urinary Tract: Adrenal glands are unremarkable. Kidneys are normal, without renal calculi, focal lesion, or hydronephrosis. Bladder is decompressed. Diffuse bladder wall thickening. This could be due to under distention or cystitis. No filling defects period. Stomach/Bowel: Stomach is within normal limits. Appendix is surgically absent. No evidence of bowel wall thickening, distention, or inflammatory changes. Vascular/Lymphatic: Scattered aortic calcification. No aneurysm. No significant lymphadenopathy. Reproductive: Prostate gland is enlarged, measuring 4.3 cm diameter. Other: No abdominal wall hernia or abnormality. No abdominopelvic ascites. Musculoskeletal: Postoperative right hip arthroplasty. Heterotopic ossification medial to the hip. Degenerative changes in the spine. IMPRESSION: 1. No renal or ureteral stone or obstruction. 2. Bladder wall thickening may be due to under distention or cystitis. 3. Enlarged prostate gland. 4. Mild aortic atherosclerosis. 5. Postoperative right hip arthroplasty. Electronically Signed   By: Burman Nieves M.D.   On: 02/01/2018 05:19     ____________________________________________   PROCEDURES  Critical Care performed: No   Procedure(s) performed:   Procedures   ____________________________________________   INITIAL IMPRESSION / ASSESSMENT AND PLAN / ED COURSE  As part of my medical decision making, I reviewed the following data within the electronic MEDICAL RECORD NUMBER Nursing notes reviewed and incorporated, Labs reviewed , Old chart reviewed, Discussed with radiologist and Notes from prior ED visits    Differential diagnosis includes, but is not limited to, renal colic/ureteral stone, UTI/pyelonephritis, skeletal pain/strain, ischemia, biliary colic, pneumonia, ACS.  The patient is very well-appearing and in no acute distress with normal vital signs, afebrile, no hypoxemia.  Basic metabolic panel, CBC and urinalysis are all unremarkable.  I think the most likely the patient is suffering from a kidney stone in spite of not seeing any hematuria and I will further evaluate with a CT renal stone protocol.  I am giving Toradol 15 mg IV.  Clinical Course as of Feb 02 916  Fri Feb 01, 2018  0630 No evidence of any abnormalities on the noncontrast renal stone protocol CT scan.  I reassessed the patient he says that he is still hurting even though he is very stoic and does not appear to be in pain.  He is clearly not someone who comes to the emergency department frequently or for no reason.Another thing that could be causing his discomfort is renal ischemia.  I called and  spoke with Dr. Andria MeuseStevens with radiology and discussed this possibility.  I asked whether the patient could benefit from CT angio or whether he would recommend a different sort of study to look for any other intraparenchymal issue.  After we discussed a little bit, he recommended a normal CT abdomen pelvis with IV contrast rather than angiography, feeling that we would see what we needed to and identify any acute issues in the kidney even without the  angiography phase.  I have ordered that study and updated the patient about the plan.  He agrees.   [CF]  0709 No acute findings identified on the patient's CT scan with IV contrast.  The radiologist mentioned this time about some right sided L3-4 disc protrusion, but this is not the area of the pain he is experiencing it would not explain an onset of pain up higher in the right flank that radiates down, although I do not have a better explanation at this time.  He still looks very comfortable but continues to report moderate pain.  I explained that I have nothing else that would explain his pain, at least in terms of emergent medical conditions.  His pain is quite localized to the right flank and radiating down to the right lower quadrant with no epigastric discomfort and no right upper quadrant tenderness.  I have added on hepatic function tests and lipase which were not ordered previously but anticipate that these will be normal.  I am giving him 2 Percocet at this time and a prescription for a few Percocet and he will follow-up with his outpatient doctor.  I gave my usual and customary return precautions.  CT ABDOMEN PELVIS W CONTRAST [CF]    Clinical Course User Index [CF] Loleta RoseForbach, Warren Lindahl, MD    ____________________________________________  FINAL CLINICAL IMPRESSION(S) / ED DIAGNOSES  Final diagnoses:  Right flank pain     MEDICATIONS GIVEN DURING THIS VISIT:  Medications  ketorolac (TORADOL) 30 MG/ML injection 15 mg (15 mg Intravenous Given 02/01/18 0522)  iopamidol (ISOVUE-300) 61 % injection 100 mL (100 mLs Intravenous Contrast Given 02/01/18 0635)  oxyCODONE-acetaminophen (PERCOCET/ROXICET) 5-325 MG per tablet 2 tablet (2 tablets Oral Given 02/01/18 0715)     ED Discharge Orders        Ordered    oxyCODONE-acetaminophen (PERCOCET) 5-325 MG tablet  Every 6 hours PRN     02/01/18 0712    docusate sodium (COLACE) 100 MG capsule     02/01/18 [id number]       Note:  This document was  prepared using Dragon voice recognition software and may include unintentional dictation errors.    Loleta RoseForbach, Nykira Reddix, MD 02/01/18 [id number]    Loleta RoseForbach, Clanton Emanuelson, MD 02/01/18 [id number]

## 2018-02-01 NOTE — Discharge Instructions (Signed)
Your workup in the Emergency Department today was reassuring.  We did not find any specific abnormalities to explain your symptoms.  We recommend you drink plenty of fluids, take your regular medications and/or any new ones prescribed today, and follow up with the doctor(s) listed in these documents as recommended.  Use over-the-counter ibuprofen and/or Tylenol according to label instructions for your pain.  Take Percocet as prescribed for severe pain. Do not drink alcohol, drive or participate in any other potentially dangerous activities while taking this medication as it may make you sleepy. Do not take this medication with any other sedating medications, either prescription or over-the-counter. If you were prescribed Percocet or Vicodin, do not take these with acetaminophen (Tylenol) as it is already contained within these medications.   This medication is an opiate (or narcotic) pain medication and can be habit forming.  Use it as little as possible to achieve adequate pain control.  Do not use or use it with extreme caution if you have a history of opiate abuse or dependence.  If you are on a pain contract with your primary care doctor or a pain specialist, be sure to let them know you were prescribed this medication today from the [facility] Emergency Department.  This medication is intended for your use only - do not give any to anyone else and keep it in a secure place where nobody else, especially children, have access to it.  It will also cause or worsen constipation, so you may want to consider taking an over-the-counter stool softener while you are taking this medication.  Return to the Emergency Department if you develop new or worsening symptoms that concern you.

## 2019-02-19 ENCOUNTER — Other Ambulatory Visit: Payer: Self-pay | Admitting: Family Medicine

## 2019-02-19 DIAGNOSIS — M5416 Radiculopathy, lumbar region: Secondary | ICD-10-CM

## 2019-03-11 ENCOUNTER — Encounter: Payer: Self-pay | Admitting: Physical Medicine and Rehabilitation

## 2019-03-11 ENCOUNTER — Other Ambulatory Visit: Payer: Self-pay

## 2019-03-11 ENCOUNTER — Ambulatory Visit (INDEPENDENT_AMBULATORY_CARE_PROVIDER_SITE_OTHER): Payer: Medicare Other | Admitting: Physical Medicine and Rehabilitation

## 2019-03-11 ENCOUNTER — Ambulatory Visit: Payer: Self-pay

## 2019-03-11 VITALS — BP 158/90 | HR 70

## 2019-03-11 DIAGNOSIS — M545 Low back pain, unspecified: Secondary | ICD-10-CM

## 2019-03-11 DIAGNOSIS — G8929 Other chronic pain: Secondary | ICD-10-CM

## 2019-03-11 DIAGNOSIS — M25551 Pain in right hip: Secondary | ICD-10-CM | POA: Diagnosis not present

## 2019-03-11 DIAGNOSIS — M7061 Trochanteric bursitis, right hip: Secondary | ICD-10-CM | POA: Diagnosis not present

## 2019-03-11 DIAGNOSIS — M47816 Spondylosis without myelopathy or radiculopathy, lumbar region: Secondary | ICD-10-CM

## 2019-03-11 DIAGNOSIS — Z96641 Presence of right artificial hip joint: Secondary | ICD-10-CM

## 2019-03-11 NOTE — Progress Notes (Signed)
.    Numeric Pain Rating Scale and Functional Assessment Average Pain 5 Pain Right Now 4 My pain is constant, dull and aching Pain is worse with: inactivity Pain improves with: medication and movement   In the last MONTH (on 0-10 scale) has pain interfered with the following?  1. General activity like being  able to carry out your everyday physical activities such as walking, climbing stairs, carrying groceries, or moving a chair?  Rating(9)  2. Relation with others like being able to carry out your usual social activities and roles such as  activities at home, at work and in your community. Rating(9)  3. Enjoyment of life such that you have  been bothered by emotional problems such as feeling anxious, depressed or irritable?  Rating(6)

## 2019-03-12 ENCOUNTER — Encounter: Payer: Self-pay | Admitting: Physical Medicine and Rehabilitation

## 2019-03-12 ENCOUNTER — Ambulatory Visit (INDEPENDENT_AMBULATORY_CARE_PROVIDER_SITE_OTHER): Payer: Medicare Other | Admitting: Physical Medicine and Rehabilitation

## 2019-03-12 ENCOUNTER — Ambulatory Visit: Payer: Self-pay

## 2019-03-12 VITALS — BP 160/91 | HR 67

## 2019-03-12 DIAGNOSIS — M5416 Radiculopathy, lumbar region: Secondary | ICD-10-CM | POA: Diagnosis not present

## 2019-03-12 MED ORDER — BETAMETHASONE SOD PHOS & ACET 6 (3-3) MG/ML IJ SUSP
12.0000 mg | Freq: Once | INTRAMUSCULAR | Status: AC
  Administered 2019-03-12: 12 mg

## 2019-03-12 NOTE — Progress Notes (Signed)
  Numeric Pain Rating Scale and Functional Assessment Average Pain 5   In the last MONTH (on 0-10 scale) has pain interfered with the following?  1. General activity like being  able to carry out your everyday physical activities such as walking, climbing stairs, carrying groceries, or moving a chair?  Rating(9)   -Driver, -BT, -Dye Allergies.  

## 2019-03-13 NOTE — Procedures (Signed)
Lumbosacral Transforaminal Epidural Steroid Injection - Sub-Pedicular Approach with Fluoroscopic Guidance  Patient: Antonio [name]      Date of Birth: 05/27/1936 MRN: [id number] PCP: [name] Downing, MD      Visit Date: 03/12/2019   Universal Protocol:    Date/Time: 03/12/2019  Consent Given By: the patient  Position: PRONE  Additional Comments: Vital signs were monitored before and after the procedure. Patient was prepped and draped in the usual sterile fashion. The correct patient, procedure, and site was verified.   Injection Procedure Details:  Procedure Site One Meds Administered:  Meds ordered this encounter  Medications  . betamethasone acetate-betamethasone sodium phosphate (CELESTONE) injection 12 mg    Laterality: Right  Location/Site:  L5-S1  Needle size: 22 G  Needle type: Spinal  Needle Placement: Transforaminal  Findings:    -Comments: Excellent flow of contrast along the nerve and into the epidural space.  Procedure Details: After squaring off the end-plates to get a true AP view, the C-arm was positioned so that an oblique view of the foramen as noted above was visualized. The target area is just inferior to the "nose of the scotty dog" or sub pedicular. The soft tissues overlying this structure were infiltrated with 2-3 ml. of 1% Lidocaine without Epinephrine.  The spinal needle was inserted toward the target using a "trajectory" view along the fluoroscope beam.  Under AP and lateral visualization, the needle was advanced so it did not puncture dura and was located close the 6 O'Clock position of the pedical in AP tracterory. Biplanar projections were used to confirm position. Aspiration was confirmed to be negative for CSF and/or blood. A 1-2 ml. volume of Isovue-250 was injected and flow of contrast was noted at each level. Radiographs were obtained for documentation purposes.   After attaining the desired flow of contrast documented above, a  0.5 to 1.0 ml test dose of 0.25% Marcaine was injected into each respective transforaminal space.  The patient was observed for 90 seconds post injection.  After no sensory deficits were reported, and normal lower extremity motor function was noted,   the above injectate was administered so that equal amounts of the injectate were placed at each foramen (level) into the transforaminal epidural space.   Additional Comments:  The patient tolerated the procedure well Dressing: 2 x 2 sterile gauze and Band-Aid    Post-procedure details: Patient was observed during the procedure. Post-procedure instructions were reviewed.  Patient left the clinic in stable condition.

## 2019-03-13 NOTE — Progress Notes (Signed)
Antonio [name] - 83 y.o. male MRN [id number]  Date of birth: [birth year]  Office Visit Note: Visit Date: 03/12/2019 PCP: [name] Downing, MD Referred by: [name] Downing, *  Subjective: Chief Complaint  Patient presents with  . Lower Back - Pain   HPI:  Antonio [name] is a 83 y.o. male who comes in today For planned right L5 transforaminal epidural steroid injection.  Please see their notes from yesterday for full evaluation and justification.  ROS Otherwise per HPI.  Assessment & Plan: Visit Diagnoses:  1. Lumbar radiculopathy     Plan: No additional findings.   Meds & Orders:  Meds ordered this encounter  Medications  . betamethasone acetate-betamethasone sodium phosphate (CELESTONE) injection 12 mg    Orders Placed This Encounter  Procedures  . XR C-ARM NO REPORT  . Epidural Steroid injection    Follow-up: Return if symptoms worsen or fail to improve.   Procedures: No procedures performed  Lumbosacral Transforaminal Epidural Steroid Injection - Sub-Pedicular Approach with Fluoroscopic Guidance  Patient: Antonio [name]      Date of Birth: January 23, 1936 MRN: [id number] PCP: [name] Downing, MD      Visit Date: 03/12/2019   Universal Protocol:    Date/Time: 03/12/2019  Consent Given By: the patient  Position: PRONE  Additional Comments: Vital signs were monitored before and after the procedure. Patient was prepped and draped in the usual sterile fashion. The correct patient, procedure, and site was verified.   Injection Procedure Details:  Procedure Site One Meds Administered:  Meds ordered this encounter  Medications  . betamethasone acetate-betamethasone sodium phosphate (CELESTONE) injection 12 mg    Laterality: Right  Location/Site:  L5-S1  Needle size: 22 G  Needle type: Spinal  Needle Placement: Transforaminal  Findings:    -Comments: Excellent flow of contrast along the nerve and into the epidural space.   Procedure Details: After squaring off the end-plates to get a true AP view, the C-arm was positioned so that an oblique view of the foramen as noted above was visualized. The target area is just inferior to the "nose of the scotty dog" or sub pedicular. The soft tissues overlying this structure were infiltrated with 2-3 ml. of 1% Lidocaine without Epinephrine.  The spinal needle was inserted toward the target using a "trajectory" view along the fluoroscope beam.  Under AP and lateral visualization, the needle was advanced so it did not puncture dura and was located close the 6 O'Clock position of the pedical in AP tracterory. Biplanar projections were used to confirm position. Aspiration was confirmed to be negative for CSF and/or blood. A 1-2 ml. volume of Isovue-250 was injected and flow of contrast was noted at each level. Radiographs were obtained for documentation purposes.   After attaining the desired flow of contrast documented above, a 0.5 to 1.0 ml test dose of 0.25% Marcaine was injected into each respective transforaminal space.  The patient was observed for 90 seconds post injection.  After no sensory deficits were reported, and normal lower extremity motor function was noted,   the above injectate was administered so that equal amounts of the injectate were placed at each foramen (level) into the transforaminal epidural space.   Additional Comments:  The patient tolerated the procedure well Dressing: 2 x 2 sterile gauze and Band-Aid    Post-procedure details: Patient was observed during the procedure. Post-procedure instructions were reviewed.  Patient left the clinic in stable condition.    Clinical History: No  specialty comments available.     Objective:  VS:  HT:    WT:   BMI:     BP:(!) 160/91  HR:67bpm  TEMP: ( )  RESP:  Physical Exam  Ortho Exam Imaging: Xr C-arm No Report  Result Date: 03/12/2019 Please see Notes tab for imaging impression.

## 2019-04-25 ENCOUNTER — Telehealth: Payer: Self-pay | Admitting: Radiology

## 2019-04-25 NOTE — Telephone Encounter (Signed)
Patient called triage requesting appointment with Dr. Ernestina Patches for another back injection.  Last seen 03/12/19 right L5-S1 transforaminal injection States pain has returned, not as severe as previous visit. Right side lower back pain with radiating pain and numbness to right leg No new injury  Return call [phone]

## 2019-04-28 NOTE — Telephone Encounter (Signed)
S/W pt and they are scheduled 05/12/2019 with a driver.

## 2019-04-28 NOTE — Telephone Encounter (Signed)
Notification or Prior Authorization is not required for the requested services  This [employer] members plan does not currently require a prior authorization for [phone] [phone]  Decision ID #:[id number]

## 2019-04-28 NOTE — Telephone Encounter (Signed)
Can you schedule     Thanks

## 2019-04-28 NOTE — Telephone Encounter (Signed)
Can you or get Tonisha to schedule repeat

## 2019-05-12 ENCOUNTER — Encounter: Payer: Self-pay | Admitting: Physical Medicine and Rehabilitation

## 2019-05-12 ENCOUNTER — Ambulatory Visit (INDEPENDENT_AMBULATORY_CARE_PROVIDER_SITE_OTHER): Payer: Medicare Other | Admitting: Physical Medicine and Rehabilitation

## 2019-05-12 ENCOUNTER — Ambulatory Visit: Payer: Self-pay

## 2019-05-12 VITALS — BP 160/84 | HR 66

## 2019-05-12 DIAGNOSIS — M5116 Intervertebral disc disorders with radiculopathy, lumbar region: Secondary | ICD-10-CM

## 2019-05-12 DIAGNOSIS — M5416 Radiculopathy, lumbar region: Secondary | ICD-10-CM | POA: Diagnosis not present

## 2019-05-12 MED ORDER — BETAMETHASONE SOD PHOS & ACET 6 (3-3) MG/ML IJ SUSP
12.0000 mg | Freq: Once | INTRAMUSCULAR | Status: AC
  Administered 2019-05-12: 12 mg

## 2019-05-12 NOTE — Progress Notes (Signed)
 .  Numeric Pain Rating Scale and Functional Assessment Average Pain 6   In the last MONTH (on 0-10 scale) has pain interfered with the following?  1. General activity like being  able to carry out your everyday physical activities such as walking, climbing stairs, carrying groceries, or moving a chair?  Rating(7)   +Driver, -BT, -Dye Allergies.  

## 2019-05-13 ENCOUNTER — Encounter: Payer: Self-pay | Admitting: Physical Medicine and Rehabilitation

## 2019-05-13 NOTE — Progress Notes (Signed)
Antonio [name] - 83 y.o. male MRN [id number]  Date of birth: 1936/06/30  Office Visit Note: Visit Date: 03/11/2019 PCP: [name] Downing, MD Referred by: [name] Downing, *  Subjective: Chief Complaint  Patient presents with   Lower Back - Pain   HPI: Antonio [name] is a 83 y.o. male who comes in today As a new patient referral from his primary care physician Dr. Arelia Sneddon and somewhat of a self-referral.  Patient has been in our office before.  He has seen Dr. Ninfa Linden for right total hip replacement in 2015 but is not been seen since that time.  He reports several months now of unrelenting right buttock pain radiating to the lateral thigh sometimes into the lateral calf and a somewhat L5 distribution.  He reports that in June he actually had more right flank pain with pain referring somewhat into the inguinal area.  He did go to the emergency department and they were wondering if he had a kidney stone and this was ruled out.  He was started on some oxycodone which she did take a small amount but is not taking it currently.  He also was given muscle relaxer that really did not help much.  He is also been followed by his primary care physician.  He reports no paresthesias.  He does report that it wakes him up at night sometimes.  He has had no unexplained weight loss.  He has had no focal weakness.  Again no specific trauma just sudden onset and now pain that she has taken with him.  He denies any real left-sided complaints.  The hip and leg pain is much worse than the back pain.  He rates this as a 5 out of 10 but it is constant dull and aching.  Is worse without doing anything and seems to be better with walking and moving.  It can hurt while he sits.  He has been using prednisone has really been taking this off and on almost as an as needed basis for 2 months which seems to be an odd way to taking that medication.  He reports being treated for a back problem in 2003 at [facility].  CT scan of the abdomen and pelvis was completed at the emergency department and that is reviewed below.  Interestingly did show disc extrusion at C3-4.  Patient has not had MRI.  He is able to have an MRI if needed.  Review of Systems  Constitutional: Negative for chills, fever, malaise/fatigue and weight loss.  HENT: Negative for hearing loss and sinus pain.   Eyes: Negative for blurred vision, double vision and photophobia.  Respiratory: Negative for cough and shortness of breath.   Cardiovascular: Negative for chest pain, palpitations and leg swelling.  Gastrointestinal: Negative for abdominal pain, nausea and vomiting.  Genitourinary: Negative for flank pain.  Musculoskeletal: Positive for back pain and joint pain. Negative for myalgias.  Skin: Negative for itching and rash.  Neurological: Negative for tremors, focal weakness and weakness.  Endo/Heme/Allergies: Negative.   Psychiatric/Behavioral: Negative for depression.  All other systems reviewed and are negative.  Otherwise per HPI.  Assessment & Plan: Visit Diagnoses:  1. Chronic right-sided low back pain, unspecified whether sciatica present   2. Greater trochanteric bursitis, right   3. Pain in right hip   4. Spondylosis without myelopathy or radiculopathy, lumbar region   5. History of total hip arthroplasty, right     Plan: Findings:  Chronic history of  intermittent back pain with history of prior right total hip replacement by Dr. Magnus Ivan in 2015.  More recent onset of severe right flank pain radiating [place] into the thigh and into the leg.  Is been ongoing now for several months.  First was in June.  CT scan of the abdomen pelvis shows disc herniation at C3-4 impacting the lateral recess.  His symptoms are more L5 related.  X-rays and imaging showed foraminal narrowing at L5.  We discussed this at length with him and I do think he deserves at this point diagnostic hopefully therapeutic L5 transforaminal  injection.  This will be diagnostic and hopefully therapeutic.  If it is not very helpful would look at injection at L3-4 versus MRI of the lumbar spine.  We talked about the natural history of disc herniations and I think clinically this is what is causing a lot of his symptoms.  He does have some arthritis but no real high-grade congenital stenosis which she would have already had at his age of 15.  He will continue with current medications and we did talk about activity modifications.  We will see him back for the injection.    Meds & Orders: No orders of the defined types were placed in this encounter.   Orders Placed This Encounter  Procedures   XR Lumbar Spine 2-3 Views    Follow-up: Return for Right L5 transforaminal epidural steroid injection.   Procedures: No procedures performed  No notes on file   Clinical History: CT ABDOMEN AND PELVIS WITH CONTRAST  TECHNIQUE: Multidetector CT imaging of the abdomen and pelvis was performed using the standard protocol following bolus administration of intravenous contrast.  CONTRAST:  ISOVUE-300 IOPAMIDOL (ISOVUE-300) INJECTION 61%  COMPARISON:  02/01/2018 CT abdomen and pelvis.  FINDINGS: Lower chest: No acute abnormality.  Hepatobiliary: No focal liver abnormality is seen. No gallstones, gallbladder wall thickening, or biliary dilatation.  Pancreas: Unremarkable. No pancreatic ductal dilatation or surrounding inflammatory changes.  Spleen: Normal in size without focal abnormality.  Adrenals/Urinary Tract: Adrenal glands are unremarkable. Kidneys are normal, without renal calculi, focal lesion, or hydronephrosis. Bladder is unremarkable.  Stomach/Bowel: Stomach is within normal limits. Appendectomy. No evidence of bowel wall thickening, distention, or inflammatory changes.  Vascular/Lymphatic: Aortic atherosclerosis. No enlarged abdominal or pelvic lymph nodes.  Reproductive: Moderate prostate  enlargement.  Other: No abdominal wall hernia or abnormality. No abdominopelvic ascites. Stable postsurgical changes within the lower ventral anterior abdominal wall.  Musculoskeletal: Right hip arthroplasty postsurgical changes. Heterotopic ossification medial to the hip. No acute osseous abnormality. Advanced degenerative changes of the spine. Right L3-4 disc extrusion with superior migration (series 6, image 65).  IMPRESSION: 1. Advanced lumbar spine degenerative changes. Right L3-4 disc extrusion with superior migration. 2. Moderate prostate enlargement. 3. Aortic atherosclerosis. 4. No urinary stone disease or hydronephrosis.   Electronically Signed   By: Mitzi Hansen M.D.   On: 02/01/2018 06:59   He reports that he has never smoked. He has never used smokeless tobacco. No results for input(s): HGBA1C, LABURIC in the last 8760 hours.  Objective:  VS:  HT:     WT:    BMI:      BP:(!) 158/90   HR:70bpm   TEMP: ( )   RESP:  Physical Exam Vitals signs and nursing note reviewed.  Constitutional:      General: He is not in acute distress.    Appearance: He is well-developed.  HENT:     Head: Normocephalic and atraumatic.  Nose: Nose normal.     Mouth/Throat:     Mouth: Mucous membranes are moist.     Pharynx: Oropharynx is clear.  Eyes:     Conjunctiva/sclera: Conjunctivae normal.     Pupils: Pupils are equal, round, and reactive to light.  Neck:     Musculoskeletal: Normal range of motion and neck supple.     Trachea: No tracheal deviation.  Cardiovascular:     Rate and Rhythm: Normal rate and regular rhythm.     Pulses: Normal pulses.  Pulmonary:     Effort: Pulmonary effort is normal.     Breath sounds: Normal breath sounds.  Abdominal:     General: There is no distension.     Palpations: Abdomen is soft.     Tenderness: There is no guarding or rebound.  Musculoskeletal:        General: No deformity.     Right lower leg: No edema.     Left  lower leg: No edema.     Comments: Patient somewhat slow to get up sit to stand with full extension does have some back pain concordant with extension and facet loading.  He has no pain over the greater trochanters and no pain with hip rotation.  He has a negative slump test on the right and left with good distal strength.  Skin:    General: Skin is warm and dry.     Findings: No erythema or rash.  Neurological:     General: No focal deficit present.     Mental Status: He is alert and oriented to person, place, and time.     Motor: No abnormal muscle tone.     Coordination: Coordination normal.     Gait: Gait normal.  Psychiatric:        Mood and Affect: Mood normal.        Behavior: Behavior normal.        Thought Content: Thought content normal.     Ortho Exam Imaging: Xr C-arm No Report  Result Date: 05/12/2019 Please see Notes tab for imaging impression.   Past Medical/Family/Surgical/Social History: Medications & Allergies reviewed per EMR, new medications updated. Patient Active Problem List   Diagnosis Date Noted   Primary osetoarthritis of left hip 07/28/2014   Status post total replacement of right hip 07/28/2014   Past Medical History:  Diagnosis Date   Arthritis    History reviewed. No pertinent family history. Past Surgical History:  Procedure Laterality Date   APPENDECTOMY     TOTAL HIP ARTHROPLASTY Right 07/28/2014   Procedure: RIGHT TOTAL HIP ARTHROPLASTY ANTERIOR APPROACH;  Surgeon: Kathryne Hitchhristopher Y Blackman, MD;  Location: [facility] OR;  Service: Orthopedics;  Laterality: Right;   Social History   Occupational History   Not on file  Tobacco Use   Smoking status: Never Smoker   Smokeless tobacco: Never Used  Substance and Sexual Activity   Alcohol use: No   Drug use: No   Sexual activity: Not on file

## 2019-05-13 NOTE — Progress Notes (Signed)
Antonio [name] - 83 y.o. male MRN [id number]  Date of birth: [birth year]  Office Visit Note: Visit Date: 05/12/2019 PCP: Kaleen Mask, MD Referred by: Kaleen Mask, *  Subjective: Chief Complaint  Patient presents with  . Right Leg - Pain   HPI:  Antonio [name] is a 83 y.o. male who comes in today For planned repeat L5 transforaminal injection versus L4 transforaminal injection.  Patient had L5 transforaminal epidural steroid injection on the right diagnostically and we were hoping therapeutically at the end of July.  Reports 2 weeks of almost 100% pain relief of his right hip and leg pain.  It has now returned and is just as bad as it was.  Rates as a 6 out of 10 without any red flag complaints.  Please see our prior notes for the details and justification.  He has had home exercise program and he has had prior physical therapy.  He is status post right total hip replacement by Dr. Magnus Ivan.  He has not followed up with him recently.  There is no groin pain.  No left-sided pain.  I think the best approach is to repeat the L5 injection 1 time and just see if he gets longer relief if he does not we need to repeat MRI of the lumbar spine and regroup with physical therapy and medication management.  He initially tried muscle relaxer and oxycodone through the emergency department and that is not really helped at all.  ROS Otherwise per HPI.  Assessment & Plan: Visit Diagnoses:  1. Lumbar radiculopathy   2. Radiculopathy due to lumbar intervertebral disc disorder     Plan: No additional findings.   Meds & Orders:  Meds ordered this encounter  Medications  . betamethasone acetate-betamethasone sodium phosphate (CELESTONE) injection 12 mg    Orders Placed This Encounter  Procedures  . XR C-ARM NO REPORT  . Epidural Steroid injection    Follow-up: No follow-ups on file.   Procedures: No procedures performed  Lumbosacral Transforaminal Epidural Steroid Injection -  Sub-Pedicular Approach with Fluoroscopic Guidance  Patient: Antonio [name]      Date of Birth: 01-Jul-1936 MRN: [id number] PCP: Kaleen Mask, MD      Visit Date: 05/12/2019   Universal Protocol:    Date/Time: 05/12/2019  Consent Given By: the patient  Position: PRONE  Additional Comments: Vital signs were monitored before and after the procedure. Patient was prepped and draped in the usual sterile fashion. The correct patient, procedure, and site was verified.   Injection Procedure Details:  Procedure Site One Meds Administered:  Meds ordered this encounter  Medications  . betamethasone acetate-betamethasone sodium phosphate (CELESTONE) injection 12 mg    Laterality: Right  Location/Site:  L5-S1  Needle size: 22 G  Needle type: Spinal  Needle Placement: Transforaminal  Findings:    -Comments: Excellent flow of contrast along the nerve and into the epidural space.  Procedure Details: After squaring off the end-plates to get a true AP view, the C-arm was positioned so that an oblique view of the foramen as noted above was visualized. The target area is just inferior to the "nose of the scotty dog" or sub pedicular. The soft tissues overlying this structure were infiltrated with 2-3 ml. of 1% Lidocaine without Epinephrine.  The spinal needle was inserted toward the target using a "trajectory" view along the fluoroscope beam.  Under AP and lateral visualization, the needle was advanced so it did not puncture dura and  was located close the 6 O'Clock position of the pedical in AP tracterory. Biplanar projections were used to confirm position. Aspiration was confirmed to be negative for CSF and/or blood. A 1-2 ml. volume of Isovue-250 was injected and flow of contrast was noted at each level. Radiographs were obtained for documentation purposes.   After attaining the desired flow of contrast documented above, a 0.5 to 1.0 ml test dose of 0.25% Marcaine was injected  into each respective transforaminal space.  The patient was observed for 90 seconds post injection.  After no sensory deficits were reported, and normal lower extremity motor function was noted,   the above injectate was administered so that equal amounts of the injectate were placed at each foramen (level) into the transforaminal epidural space.   Additional Comments:  The patient tolerated the procedure well Dressing: 2 x 2 sterile gauze and Band-Aid    Post-procedure details: Patient was observed during the procedure. Post-procedure instructions were reviewed.  Patient left the clinic in stable condition.    Clinical History: CT ABDOMEN AND PELVIS WITH CONTRAST  TECHNIQUE: Multidetector CT imaging of the abdomen and pelvis was performed using the standard protocol following bolus administration of intravenous contrast.  CONTRAST:  145mL ISOVUE-300 IOPAMIDOL (ISOVUE-300) INJECTION 61%  COMPARISON:  02/01/2018 CT abdomen and pelvis.  FINDINGS: Lower chest: No acute abnormality.  Hepatobiliary: No focal liver abnormality is seen. No gallstones, gallbladder wall thickening, or biliary dilatation.  Pancreas: Unremarkable. No pancreatic ductal dilatation or surrounding inflammatory changes.  Spleen: Normal in size without focal abnormality.  Adrenals/Urinary Tract: Adrenal glands are unremarkable. Kidneys are normal, without renal calculi, focal lesion, or hydronephrosis. Bladder is unremarkable.  Stomach/Bowel: Stomach is within normal limits. Appendectomy. No evidence of bowel wall thickening, distention, or inflammatory changes.  Vascular/Lymphatic: Aortic atherosclerosis. No enlarged abdominal or pelvic lymph nodes.  Reproductive: Moderate prostate enlargement.  Other: No abdominal wall hernia or abnormality. No abdominopelvic ascites. Stable postsurgical changes within the lower ventral anterior abdominal wall.  Musculoskeletal: Right hip  arthroplasty postsurgical changes. Heterotopic ossification medial to the hip. No acute osseous abnormality. Advanced degenerative changes of the spine. Right L3-4 disc extrusion with superior migration (series 6, image 65).  IMPRESSION: 1. Advanced lumbar spine degenerative changes. Right L3-4 disc extrusion with superior migration. 2. Moderate prostate enlargement. 3. Aortic atherosclerosis. 4. No urinary stone disease or hydronephrosis.   Electronically Signed   By: Kristine Garbe M.D.   On: 02/01/2018 06:59     Objective:  VS:  HT:    WT:   BMI:     BP:(!) 160/84  HR:66bpm  TEMP: ( )  RESP:  Physical Exam  Ortho Exam Imaging: Xr C-arm No Report  Result Date: 05/12/2019 Please see Notes tab for imaging impression.

## 2019-05-13 NOTE — Procedures (Signed)
Lumbosacral Transforaminal Epidural Steroid Injection - Sub-Pedicular Approach with Fluoroscopic Guidance  Patient: Antonio [name]      Date of Birth: [birth year] MRN: [id number] PCP: [name] Downing, MD      Visit Date: 05/12/2019   Universal Protocol:    Date/Time: 05/12/2019  Consent Given By: the patient  Position: PRONE  Additional Comments: Vital signs were monitored before and after the procedure. Patient was prepped and draped in the usual sterile fashion. The correct patient, procedure, and site was verified.   Injection Procedure Details:  Procedure Site One Meds Administered:  Meds ordered this encounter  Medications  . betamethasone acetate-betamethasone sodium phosphate (CELESTONE) injection 12 mg    Laterality: Right  Location/Site:  L5-S1  Needle size: 22 G  Needle type: Spinal  Needle Placement: Transforaminal  Findings:    -Comments: Excellent flow of contrast along the nerve and into the epidural space.  Procedure Details: After squaring off the end-plates to get a true AP view, the C-arm was positioned so that an oblique view of the foramen as noted above was visualized. The target area is just inferior to the "nose of the scotty dog" or sub pedicular. The soft tissues overlying this structure were infiltrated with 2-3 ml. of 1% Lidocaine without Epinephrine.  The spinal needle was inserted toward the target using a "trajectory" view along the fluoroscope beam.  Under AP and lateral visualization, the needle was advanced so it did not puncture dura and was located close the 6 O'Clock position of the pedical in AP tracterory. Biplanar projections were used to confirm position. Aspiration was confirmed to be negative for CSF and/or blood. A 1-2 ml. volume of Isovue-250 was injected and flow of contrast was noted at each level. Radiographs were obtained for documentation purposes.   After attaining the desired flow of contrast documented above, a  0.5 to 1.0 ml test dose of 0.25% Marcaine was injected into each respective transforaminal space.  The patient was observed for 90 seconds post injection.  After no sensory deficits were reported, and normal lower extremity motor function was noted,   the above injectate was administered so that equal amounts of the injectate were placed at each foramen (level) into the transforaminal epidural space.   Additional Comments:  The patient tolerated the procedure well Dressing: 2 x 2 sterile gauze and Band-Aid    Post-procedure details: Patient was observed during the procedure. Post-procedure instructions were reviewed.  Patient left the clinic in stable condition.

## 2019-06-02 ENCOUNTER — Telehealth: Payer: Self-pay | Admitting: Physical Medicine and Rehabilitation

## 2019-06-02 NOTE — Telephone Encounter (Signed)
We need to get MRI lspine, disc herniation seen on CT pelvis but would be good to see MRI and maybe get better idea of where to inject or extent of nerve encroachment.

## 2019-06-03 ENCOUNTER — Other Ambulatory Visit: Payer: Self-pay | Admitting: Physical Medicine and Rehabilitation

## 2019-06-03 DIAGNOSIS — M5116 Intervertebral disc disorders with radiculopathy, lumbar region: Secondary | ICD-10-CM

## 2019-06-03 DIAGNOSIS — M5416 Radiculopathy, lumbar region: Secondary | ICD-10-CM

## 2019-06-03 NOTE — Telephone Encounter (Signed)
MRI ordered. Patient aware that [facility] will call to schedule.

## 2019-06-12 ENCOUNTER — Telehealth: Payer: Self-pay | Admitting: *Deleted

## 2019-06-12 NOTE — Telephone Encounter (Signed)
Pt called left vm stating he has not heard of getting his MRI done from anyone and wanted to know what the status was, I called pt back informed him [facility] imaging left vm for him to return call to get scheduled, pt never received the message, I gave pt imaging number for him to call.

## 2019-06-17 ENCOUNTER — Encounter: Payer: Self-pay | Admitting: Nurse Practitioner

## 2019-06-24 ENCOUNTER — Telehealth: Payer: Self-pay | Admitting: Physical Medicine and Rehabilitation

## 2019-06-24 ENCOUNTER — Other Ambulatory Visit: Payer: Self-pay

## 2019-06-24 ENCOUNTER — Ambulatory Visit
Admission: RE | Admit: 2019-06-24 | Discharge: 2019-06-24 | Disposition: A | Discharge status: Home / Self Care | Payer: Medicare Other | Source: Ambulatory Visit | Attending: Physical Medicine and Rehabilitation | Admitting: Physical Medicine and Rehabilitation

## 2019-06-24 DIAGNOSIS — M5416 Radiculopathy, lumbar region: Secondary | ICD-10-CM

## 2019-06-24 DIAGNOSIS — M5116 Intervertebral disc disorders with radiculopathy, lumbar region: Secondary | ICD-10-CM

## 2019-06-24 NOTE — Telephone Encounter (Signed)
Per [facility] website no PA is needed for [phone] in an office setting.

## 2019-06-24 NOTE — Telephone Encounter (Signed)
-----   Message from Magnus Sinning, MD sent at 06/24/2019  8:12 AM EST ----- Regarding: f/up MRI Not sure if he has f/up but needs bilat L3 TF esi, if already f/up on schedule then ok

## 2019-06-26 ENCOUNTER — Encounter: Payer: Self-pay | Admitting: *Deleted

## 2019-07-14 ENCOUNTER — Ambulatory Visit: Payer: Self-pay

## 2019-07-14 ENCOUNTER — Ambulatory Visit: Payer: Medicare Other | Admitting: Physical Medicine and Rehabilitation

## 2019-07-14 ENCOUNTER — Encounter: Payer: Self-pay | Admitting: Physical Medicine and Rehabilitation

## 2019-07-14 ENCOUNTER — Other Ambulatory Visit: Payer: Self-pay

## 2019-07-14 VITALS — BP 152/108 | HR 62

## 2019-07-14 DIAGNOSIS — M5416 Radiculopathy, lumbar region: Secondary | ICD-10-CM | POA: Diagnosis not present

## 2019-07-14 DIAGNOSIS — M4807 Spinal stenosis, lumbosacral region: Secondary | ICD-10-CM

## 2019-07-14 DIAGNOSIS — M5116 Intervertebral disc disorders with radiculopathy, lumbar region: Secondary | ICD-10-CM | POA: Diagnosis not present

## 2019-07-14 DIAGNOSIS — M48062 Spinal stenosis, lumbar region with neurogenic claudication: Secondary | ICD-10-CM

## 2019-07-14 MED ORDER — BETAMETHASONE SOD PHOS & ACET 6 (3-3) MG/ML IJ SUSP
12.0000 mg | Freq: Once | INTRAMUSCULAR | Status: AC
  Administered 2019-07-14: 12 mg

## 2019-07-14 NOTE — Progress Notes (Signed)
 .  Numeric Pain Rating Scale and Functional Assessment Average Pain 5   In the last MONTH (on 0-10 scale) has pain interfered with the following?  1. General activity like being  able to carry out your everyday physical activities such as walking, climbing stairs, carrying groceries, or moving a chair?  Rating(6)   +Driver, -BT, -Dye Allergies.  

## 2019-07-15 ENCOUNTER — Encounter: Payer: Self-pay | Admitting: Physical Medicine and Rehabilitation

## 2019-07-15 NOTE — Progress Notes (Signed)
Antonio [name] - 83 y.o. male MRN [id number]  Date of birth: 01/01/1936  Office Visit Note: Visit Date: 07/14/2019 PCP: Kaleen Mask, MD Referred by: Kaleen Mask, *  Subjective: Chief Complaint  Patient presents with  . Lower Back - Pain  . Right Leg - Pain, Numbness   HPI: Antonio [name] is a 83 y.o. male who comes in today Evaluation management of low back and right hip and leg pain with paresthesia.  Patient has had a couple of L5 transforaminal epidural injections which have helped diagnostically but not for very much length of time.  He is followed in the office from an orthopedic standpoint by Dr. Doneen Poisson.  He has had right total hip replacement.  He does have osteoarthritis of the left hip.  He comes in today still complaining of right hip and leg pain down to the foot which is almost lateral and heel pain consistent with an S1 type dermatome.  Since have seen him last he has had updated MRI of the lumbar spine and this is reviewed.  He actually had a CT scan prior to this without the MRI.  CT scan did show significant disc herniation at L3.  New MRI does show that with some narrowing of the canal although it is moderate.  Not really listed on the report but at L5-S1 there is a central protrusion pretty small but there appears to be lateral recess narrowing on the right which would be consistent with an S1 type of pain.  Patient is an active 82 year old gentleman.  He has had no focal weakness or bowel or bladder difficulties no new trauma since have seen him last.  Continues with right-sided hip and leg pain.  He has had medication management with some opioid as well as prednisone.  Currently not taking specific pain medication.  Does take some anti-inflammatory.  Review of Systems  Constitutional: Negative for chills, fever, malaise/fatigue and weight loss.  HENT: Negative for hearing loss and sinus pain.   Eyes: Negative for blurred vision, double  vision and photophobia.  Respiratory: Negative for cough and shortness of breath.   Cardiovascular: Negative for chest pain, palpitations and leg swelling.  Gastrointestinal: Negative for abdominal pain, nausea and vomiting.  Genitourinary: Negative for flank pain.  Musculoskeletal: Positive for back pain. Negative for myalgias.       Right hip and leg pain paresthesia  Skin: Negative for itching and rash.  Neurological: Positive for tingling. Negative for tremors, focal weakness and weakness.  Endo/Heme/Allergies: Negative.   Psychiatric/Behavioral: Negative for depression.  All other systems reviewed and are negative.  Otherwise per HPI.  Assessment & Plan: Visit Diagnoses:  1. Lumbar radiculopathy   2. Radiculopathy due to lumbar intervertebral disc disorder   3. Spinal stenosis of lumbar region with neurogenic claudication   4. 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  Foramen at L5 is open the lateral recess to me does look narrowed.  He does have pretty significant stenosis at L3-4 from disc herniation this seems to be somewhat improved from the CT scan.  His symptoms really just are not fitting without stenosis.  At this point I think the best approach is S1 transforaminal injection just to see if he gets good relief.  If he were to get many months of relief from 1 or 2 of these injections  and I think he can continue with that and he would be happy.  If it helps once again but is very short-lived that I think the next step is referral to Dr. Vira BrownsJames Nitka or neurosurgeon if he chooses to see if there is something simple like microdiscectomy that they could perform at L5-S1.  The hard part there would be trying to not look at the stenosis above.  Injections would have been diagnostic at that point.  He could also return to see Dr. Magnus IvanBlackman for his opinion with pain which is  valued.  As far as medications go work and hold off on changing anything until we see how he is doing with the injection.  I would not prescribe chronic opioid medications this could be looked at from his primary care physician or possibly pain management if that were the case but I would hope we could get him actually in a better position where he did need chronic opioids for this.    Meds & Orders:  Meds ordered this encounter  Medications  . betamethasone acetate-betamethasone sodium phosphate (CELESTONE) injection 12 mg    Orders Placed This Encounter  Procedures  . XR C-ARM NO REPORT  . Epidural Steroid injection    Follow-up: Return if symptoms worsen or fail to improve.   Procedures: No procedures performed  S1 Lumbosacral Transforaminal Epidural Steroid Injection - Sub-Pedicular Approach with Fluoroscopic Guidance   Patient: Antonio [name]      Date of Birth: 12/05/1935 MRN: [id number] PCP: Kaleen MaskElkins, Wilson Oliver, MD      Visit Date: 07/14/2019   Universal Protocol:    Date/Time: 12/01/206:09 AM  Consent Given By: the patient  Position:  PRONE  Additional Comments: Vital signs were monitored before and after the procedure. Patient was prepped and draped in the usual sterile fashion. The correct patient, procedure, and site was verified.   Injection Procedure Details:  Procedure Site One Meds Administered:  Meds ordered this encounter  Medications  . betamethasone acetate-betamethasone sodium phosphate (CELESTONE) injection 12 mg    Laterality: Right  Location/Site:  S1 Foramen   Needle size: 22 ga.  Needle type: Spinal  Needle Placement: Transforaminal  Findings:   -Comments: Excellent flow of contrast along the nerve and into the epidural space.   Procedure Details: After squaring off the sacral end-plate to get a true AP view, the C-arm was positioned so that the best possible view of the S1 foramen was visualized. The soft tissues overlying  this structure were infiltrated with 2-3 ml. of 1% Lidocaine without Epinephrine.    The spinal needle was inserted toward the target using a "trajectory" view along the fluoroscope beam.  Under AP and lateral visualization, the needle was advanced so it did not puncture dura. Biplanar projections were used to confirm position. Aspiration was confirmed to be negative for CSF and/or blood. A 1-2 ml. volume of Isovue-250 was injected and flow of contrast was noted at each level. Radiographs were obtained for documentation purposes.   After attaining the desired flow of contrast documented above, a 0.5 to 1.0 ml test dose of 0.25% Marcaine was injected into each respective transforaminal space.  The patient was observed for 90 seconds post injection.  After no sensory deficits were reported, and normal lower extremity motor function was noted,   the above injectate was administered so that equal amounts of the injectate were placed at each foramen (level) into the transforaminal epidural space.   Additional Comments:  The patient  tolerated the procedure well Dressing: Band-Aid with 2 x 2 sterile gauze    Post-procedure details: Patient was observed during the procedure. Post-procedure instructions were reviewed.  Patient left the clinic in stable condition.    Clinical History: MRI LUMBAR SPINE WITHOUT CONTRAST  TECHNIQUE: Multiplanar, multisequence MR imaging of the lumbar spine was performed. No intravenous contrast was administered.  COMPARISON: Plain films lumbar spine 03/11/2019.  FINDINGS: Segmentation: Standard.  Alignment: No listhesis. Mild convex left curvature noted.  Vertebrae: No fracture, evidence of discitis, or bone lesion. Scattered degenerative endplate signal change is worst at L2-3 and L3-4.  Conus medullaris and cauda equina: Conus extends to the L1-2 level. Conus and cauda equina appear normal.  Paraspinal and other soft tissues: Negative.  Disc  levels:  T11-12 and T12-L1 are imaged in the sagittal plane only and negative.  T12-L1: Negative.  L1-2: There is a shallow disc bulge and mild facet degenerative change. No stenosis.  L2-3: Loss of disc space height with a shallow bulge and endplate spur. The central canal and foramina remain open.  L3-4: Loss of disc space height with a broad-based bulge. There is also some ligamentum flavum thickening and mild facet degenerative change. Moderately severe central canal stenosis is present and there is narrowing in the left subarticular recess. The neural foramina are open.  L4-5: Loss of disc space height with a shallow bulge. Ligamentum flavum thickening and moderate facet degenerative change. There is moderate central canal stenosis. Neural foramina remain open.  L5-S1: Shallow broad-based central protrusion without central canal stenosis. Mild left foraminal narrowing is noted. The right foramen is open.  IMPRESSION: Moderately severe central canal stenosis at L3-4 where there is also some narrowing in the left subarticular recess due to a broad-based disc bulge and mild ligamentum flavum thickening.  Moderate central canal stenosis at L4-5 where there is a disc bulge, some ligamentum flavum thickening and facet degenerative change.  Shallow broad-based central protrusion at L5-S1 without central canal stenosis.   Electronically Signed By: Drusilla Kanner M.D. On: 06/24/2019 07:48   He reports that he has never smoked. He has never used smokeless tobacco. No results for input(s): HGBA1C, LABURIC in the last 8760 hours.  Objective:  VS:  HT:    WT:   BMI:     BP:(!) 152/108  HR:62bpm  TEMP: ( )  RESP:  Physical Exam Vitals signs and nursing note reviewed.  Constitutional:      General: He is not in acute distress.    Appearance: Normal appearance. He is well-developed.  HENT:     Head: Normocephalic and atraumatic.  Eyes:      Conjunctiva/sclera: Conjunctivae normal.     Pupils: Pupils are equal, round, and reactive to light.  Neck:     Musculoskeletal: Normal range of motion and neck supple. No neck rigidity.  Cardiovascular:     Rate and Rhythm: Normal rate.     Pulses: Normal pulses.     Heart sounds: Normal heart sounds.  Pulmonary:     Effort: Pulmonary effort is normal. No respiratory distress.  Musculoskeletal:     Right lower leg: No edema.     Left lower leg: No edema.     Comments: Patient ambulates without aid.  He has good strength distally without clonus.  He does have decreased sensation and dysesthesia in S1 dermatome on the right.  Equivocally positive slump test.  Some pain with extension of the lumbar spine.  No pain with hip rotation.  Skin:    General: Skin is warm and dry.     Findings: No erythema or rash.  Neurological:     General: No focal deficit present.     Mental Status: He is alert and oriented to person, place, and time.     Sensory: No sensory deficit.     Coordination: Coordination normal.     Gait: Gait normal.  Psychiatric:        Mood and Affect: Mood normal.        Behavior: Behavior normal.     Ortho Exam Imaging: Xr C-arm No Report  Result Date: 07/14/2019 Please see Notes tab for imaging impression.   Past Medical/Family/Surgical/Social History: Medications & Allergies reviewed per EMR, new medications updated. Patient Active Problem List   Diagnosis Date Noted  . Primary osetoarthritis of left hip 07/28/2014  . Status post total replacement of right hip 07/28/2014   Past Medical History:  Diagnosis Date  . Arthritis    History reviewed. No pertinent family history. Past Surgical History:  Procedure Laterality Date  . APPENDECTOMY    . TOTAL HIP ARTHROPLASTY Right 07/28/2014   Procedure: RIGHT TOTAL HIP ARTHROPLASTY ANTERIOR APPROACH;  Surgeon: Mcarthur Rossetti, MD;  Location: [facility];  Service: Orthopedics;  Laterality: Right;   Social  History   Occupational History  . Not on file  Tobacco Use  . Smoking status: Never Smoker  . Smokeless tobacco: Never Used  Substance and Sexual Activity  . Alcohol use: No  . Drug use: No  . Sexual activity: Not on file

## 2019-07-15 NOTE — Procedures (Signed)
S1 Lumbosacral Transforaminal Epidural Steroid Injection - Sub-Pedicular Approach with Fluoroscopic Guidance   Patient: Antonio [name]      Date of Birth: 12/17/1935 MRN: [id number] PCP: [name] Downing, MD      Visit Date: 07/14/2019   Universal Protocol:    Date/Time: 12/01/206:09 AM  Consent Given By: the patient  Position:  PRONE  Additional Comments: Vital signs were monitored before and after the procedure. Patient was prepped and draped in the usual sterile fashion. The correct patient, procedure, and site was verified.   Injection Procedure Details:  Procedure Site One Meds Administered:  Meds ordered this encounter  Medications  . betamethasone acetate-betamethasone sodium phosphate (CELESTONE) injection 12 mg    Laterality: Right  Location/Site:  S1 Foramen   Needle size: 22 ga.  Needle type: Spinal  Needle Placement: Transforaminal  Findings:   -Comments: Excellent flow of contrast along the nerve and into the epidural space.   Procedure Details: After squaring off the sacral end-plate to get a true AP view, the C-arm was positioned so that the best possible view of the S1 foramen was visualized. The soft tissues overlying this structure were infiltrated with 2-3 ml. of 1% Lidocaine without Epinephrine.    The spinal needle was inserted toward the target using a "trajectory" view along the fluoroscope beam.  Under AP and lateral visualization, the needle was advanced so it did not puncture dura. Biplanar projections were used to confirm position. Aspiration was confirmed to be negative for CSF and/or blood. A 1-2 ml. volume of Isovue-250 was injected and flow of contrast was noted at each level. Radiographs were obtained for documentation purposes.   After attaining the desired flow of contrast documented above, a 0.5 to 1.0 ml test dose of 0.25% Marcaine was injected into each respective transforaminal space.  The patient was observed for 90  seconds post injection.  After no sensory deficits were reported, and normal lower extremity motor function was noted,   the above injectate was administered so that equal amounts of the injectate were placed at each foramen (level) into the transforaminal epidural space.   Additional Comments:  The patient tolerated the procedure well Dressing: Band-Aid with 2 x 2 sterile gauze    Post-procedure details: Patient was observed during the procedure. Post-procedure instructions were reviewed.  Patient left the clinic in stable condition.

## 2019-10-09 ENCOUNTER — Ambulatory Visit: Payer: Medicare Other | Attending: Internal Medicine

## 2019-10-09 DIAGNOSIS — Z23 Encounter for immunization: Secondary | ICD-10-CM | POA: Insufficient documentation

## 2019-10-09 NOTE — Progress Notes (Signed)
   Covid-19 Vaccination Clinic  Name:  Antonio [name]    MRN: [id number] DOB: [birth year]  10/09/2019  [name] was observed post Covid-19 immunization for 15 minutes without incidence. He was provided with Vaccine Information Sheet and instruction to access the V-Safe system.   [name] was instructed to call 911 with any severe reactions post vaccine: Marland Kitchen Difficulty breathing  . Swelling of your face and throat  . A fast heartbeat  . A bad rash all over your body  . Dizziness and weakness    Immunizations Administered    Name Date Dose VIS Date Route   Pfizer COVID-19 Vaccine 10/09/2019  8:53 AM 0.3 mL 07/25/2019 Intramuscular   Manufacturer: [employer], [employer]   Lot: [id number]   NDC: [address]-3

## 2019-11-04 ENCOUNTER — Ambulatory Visit: Payer: Medicare Other | Attending: Internal Medicine

## 2019-11-04 DIAGNOSIS — Z23 Encounter for immunization: Secondary | ICD-10-CM

## 2019-11-04 NOTE — Progress Notes (Signed)
   Covid-19 Vaccination Clinic  Name:  Antonio [name]    MRN: [id number] DOB: 09-22-1935  11/04/2019  [name] was observed post Covid-19 immunization for 15 minutes without incident. He was provided with Vaccine Information Sheet and instruction to access the V-Safe system.   [name] was instructed to call 911 with any severe reactions post vaccine: Marland Kitchen Difficulty breathing  . Swelling of face and throat  . A fast heartbeat  . A bad rash all over body  . Dizziness and weakness   Immunizations Administered    Name Date Dose VIS Date Route   Pfizer COVID-19 Vaccine 11/04/2019  8:35 AM 0.3 mL 07/25/2019 Intramuscular   Manufacturer: [employer], [employer]   Lot: [id number]   NDC: [address]-9
